# Patient Record
Sex: Female | Born: 1955 | Race: White | Hispanic: No | Marital: Married | Smoking: Never smoker
Health system: Southern US, Community
[De-identification: ages and names within clinical notes are randomized; demographics above are authoritative.]

## PROBLEM LIST (undated history)

## (undated) ENCOUNTER — Ambulatory Visit: Admission: EM | Payer: BC Managed Care – PPO | Source: Home / Self Care

## (undated) DIAGNOSIS — E785 Hyperlipidemia, unspecified: Secondary | ICD-10-CM

## (undated) DIAGNOSIS — I10 Essential (primary) hypertension: Secondary | ICD-10-CM

## (undated) DIAGNOSIS — M810 Age-related osteoporosis without current pathological fracture: Secondary | ICD-10-CM

## (undated) DIAGNOSIS — D219 Benign neoplasm of connective and other soft tissue, unspecified: Secondary | ICD-10-CM

## (undated) DIAGNOSIS — K5792 Diverticulitis of intestine, part unspecified, without perforation or abscess without bleeding: Secondary | ICD-10-CM

## (undated) HISTORY — PX: OTHER SURGICAL HISTORY: SHX169

## (undated) HISTORY — PX: ANKLE SURGERY: SHX546

---

## 2001-11-30 IMAGING — MG UNKNOWN MG STUDY
1 series · 4 of 4 positions shown · non-contrast
Comparison: none

REASON FOR EXAM: screening

[Series 9299: R CC · right · 4 of 4 slices shown]
[im 1/4]
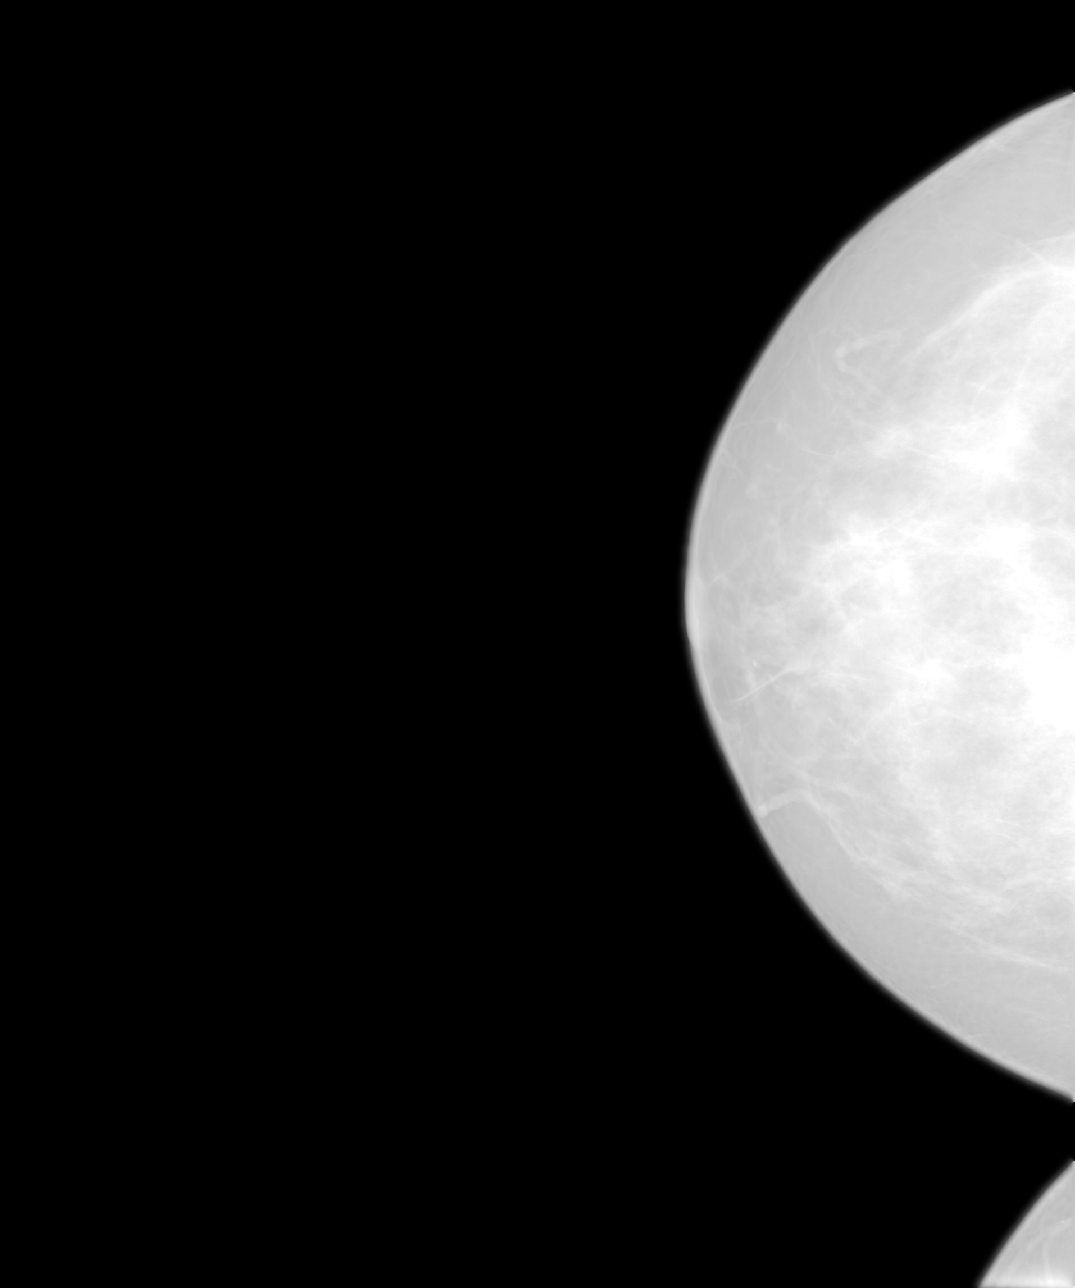
[im 2/4]
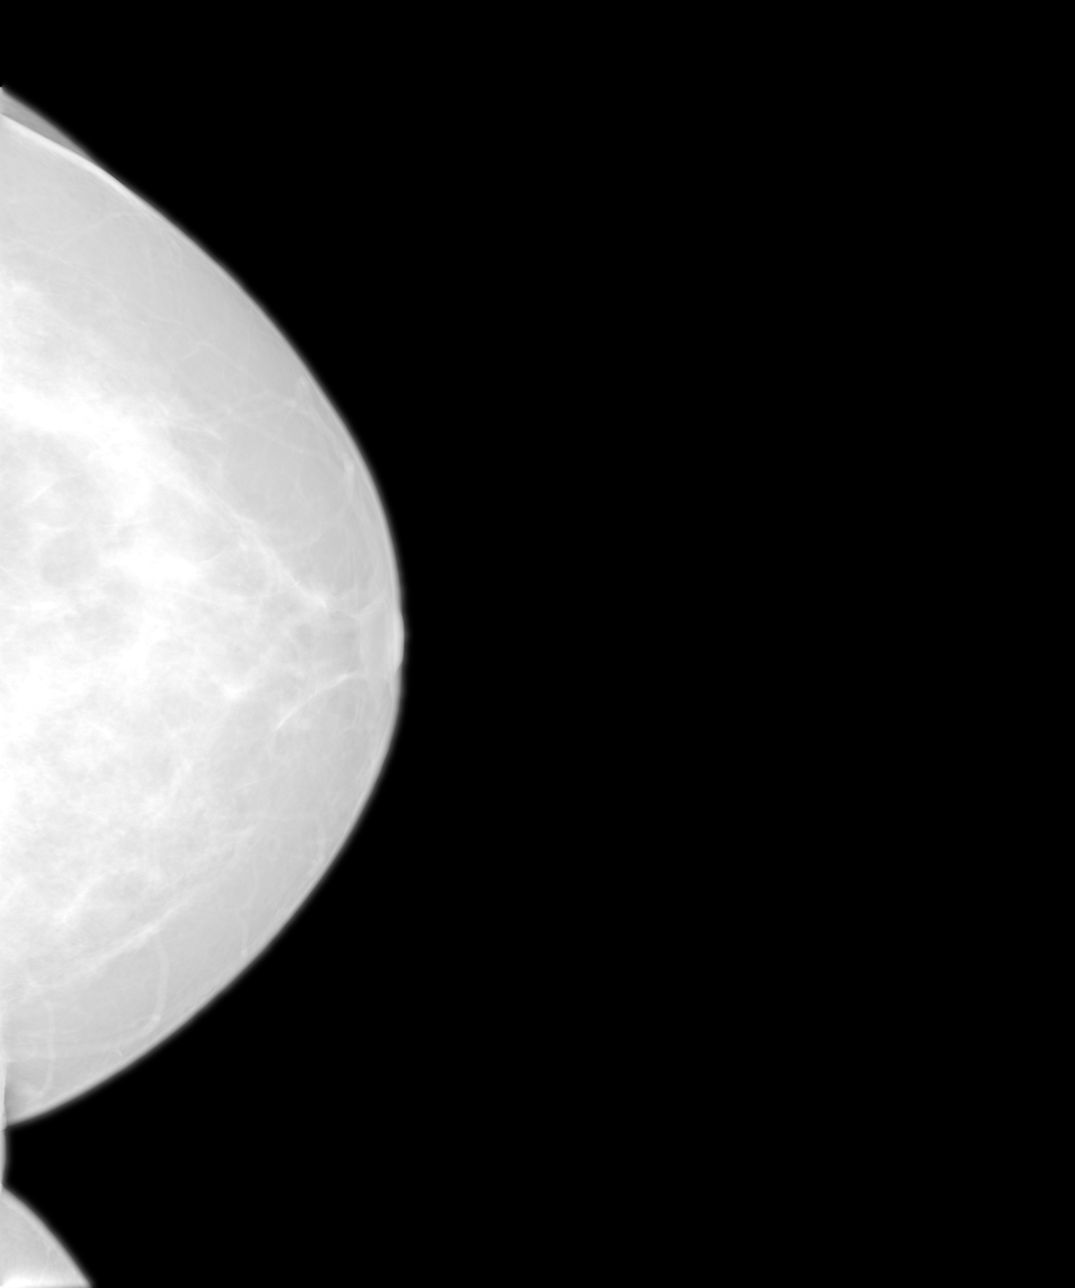
[im 3/4]
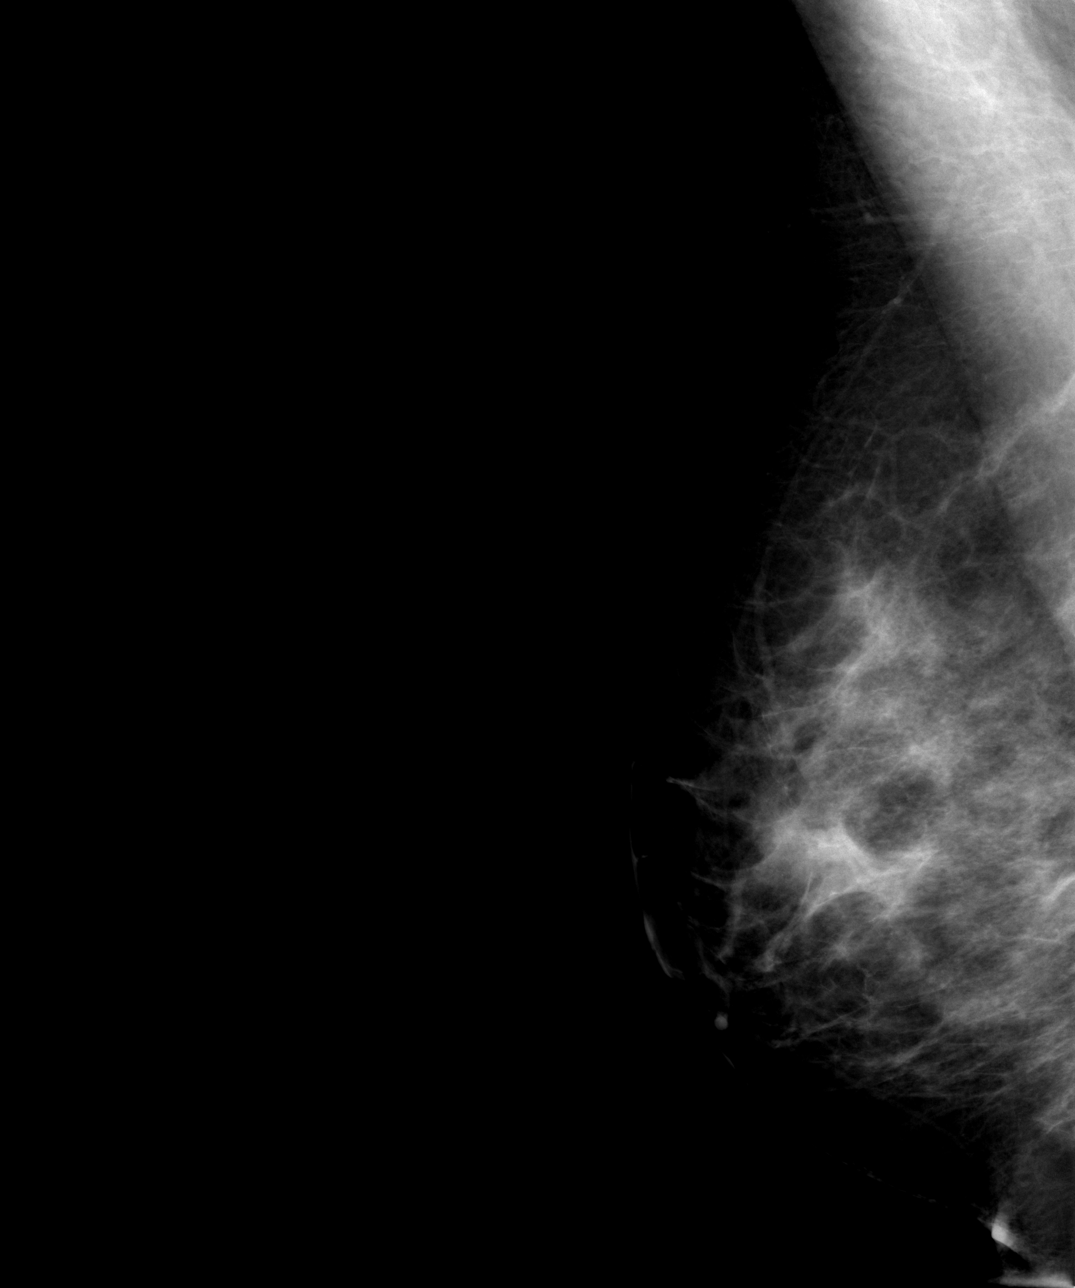
[im 4/4]
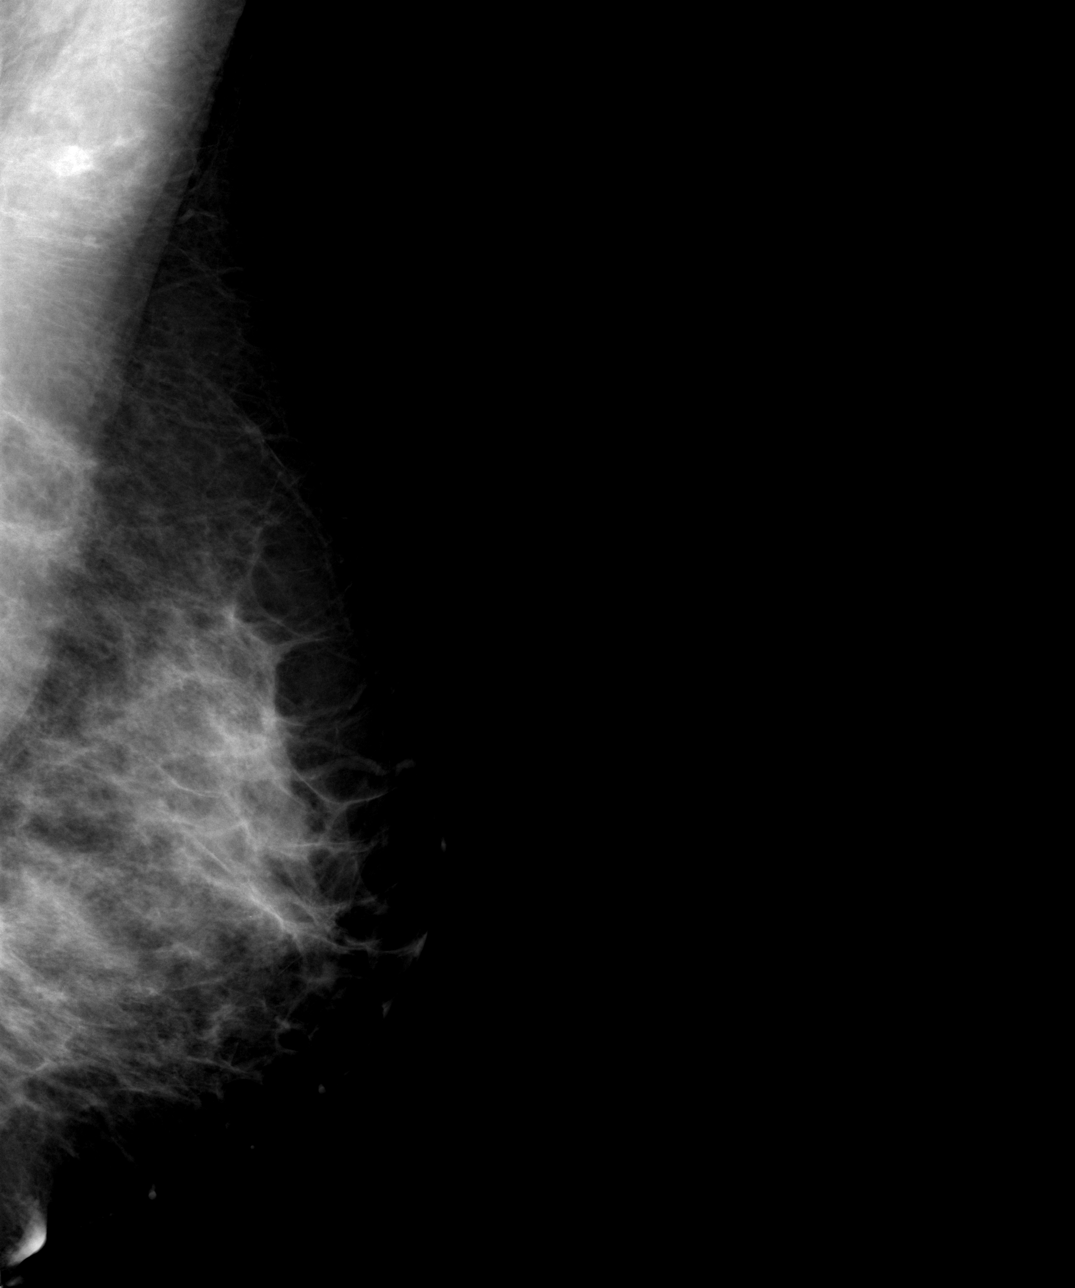

[4 of 4 positions shown; findings below may reference images not displayed]

Procedure: DIGITAL SCREENING MAMMOGRAM WITH CAD:
 Routine digital screening study is compared to the prior film screening
study dated [DATE] and a more recent study of [DATE]. The parenchymal
pattern appears to be stable with no evidence of developing density or
abnormal calcifications to suggest malignancy. Note is made on the current
digital images along the posterior margin of the RIGHT breast on the CC
there is some parenchymal density that is noted on the previous studies and
appears to be stable allowing for differences in technique and projection.
IMPRESSION: 1) Stable, benign appearing bilateral screening mammogram.

 BI-RADS: Category 2-Benign Finding
 This study was jointly interpreted by Dr. IZAMAR and Dr. IZAMAR
IZAMAR.
 A NEGATIVE MAMMOGRAM REPORT DOES NOT PRECLUDE BIOPSY OR OTHER EVALUATION OF
A CLINICALLY PALPABLE OR OTHERWISE SUSPICIOUS MASS OR LESION. BREAST CANCER
MAY NOT BE DETECTED BY MAMMOGRAPHY IN UP TO 10% OF CASES.

## 2003-09-09 ENCOUNTER — Other Ambulatory Visit: Payer: Self-pay

## 2003-12-31 ENCOUNTER — Ambulatory Visit: Payer: Self-pay | Admitting: Obstetrics and Gynecology

## 2005-02-19 ENCOUNTER — Ambulatory Visit: Payer: Self-pay | Admitting: Obstetrics and Gynecology

## 2006-11-11 ENCOUNTER — Ambulatory Visit: Payer: Self-pay | Admitting: Obstetrics and Gynecology

## 2006-11-15 ENCOUNTER — Ambulatory Visit: Payer: Self-pay | Admitting: Obstetrics and Gynecology

## 2006-11-15 IMAGING — US ULTRASOUND LEFT BREAST
1 series · 17 of 17 positions shown · non-contrast
Comparison: none

REASON FOR EXAM: US left breast dense parenchymal tissue
COMMENTS:

[Series 1: ultrasound left breast · 17 of 17 slices shown]
[im 1/17]
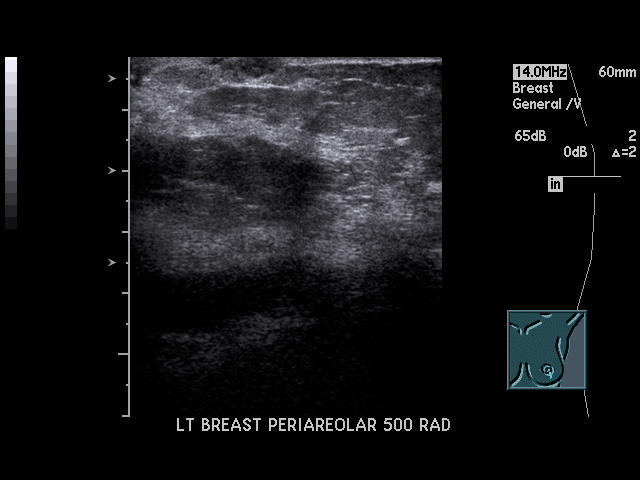
[im 2/17]
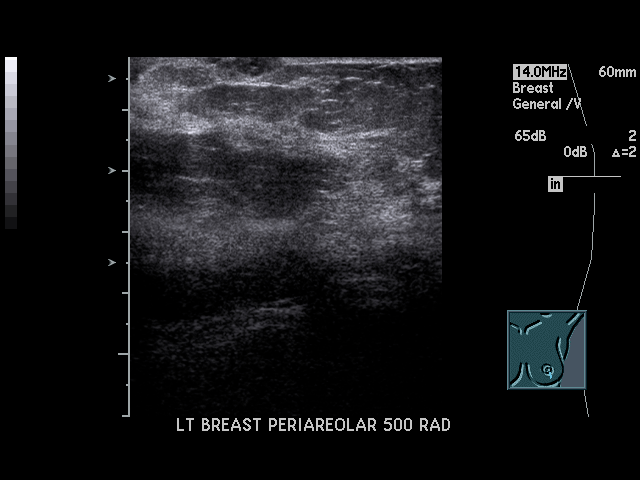
[im 3/17]
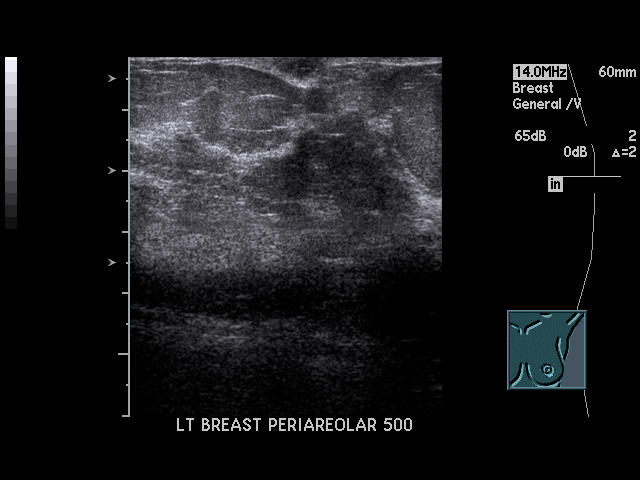
[im 4/17]
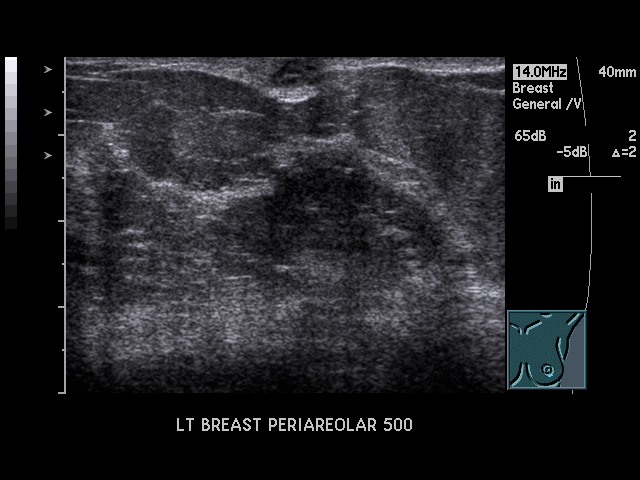
[im 5/17]
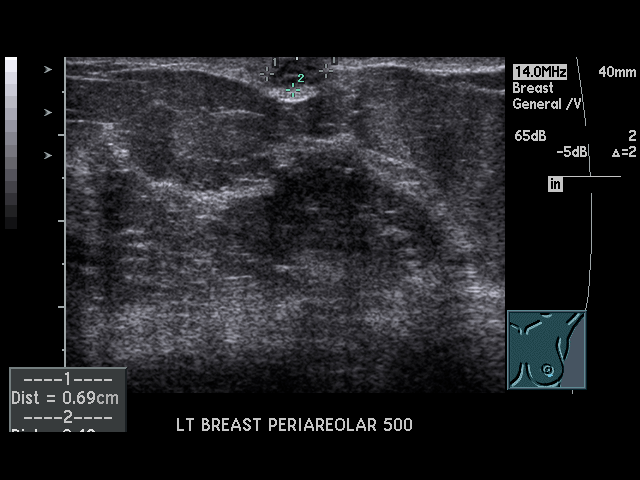
[im 6/17]
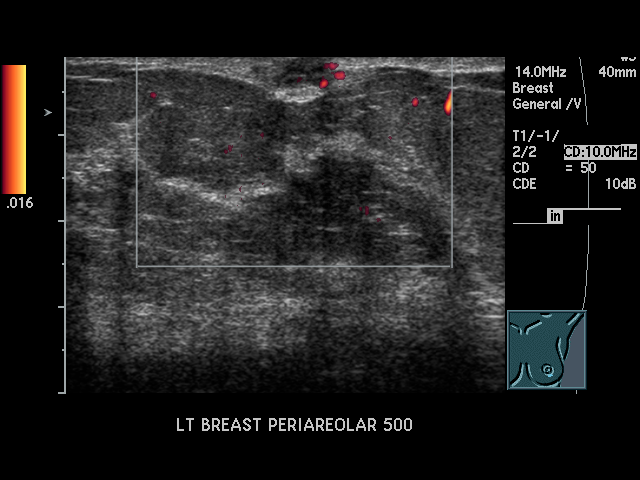
[im 7/17]
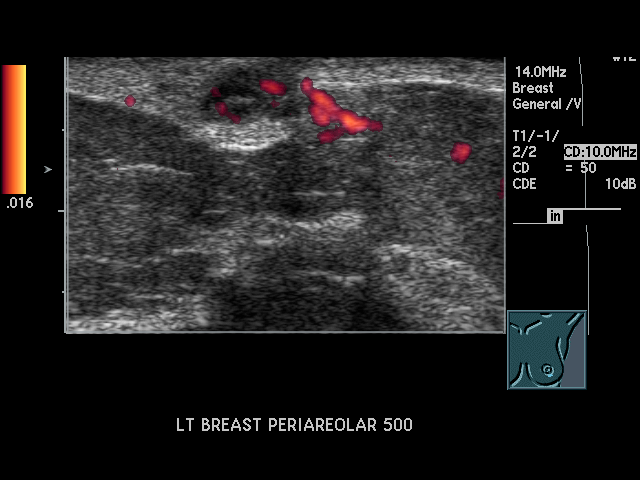
[im 8/17]
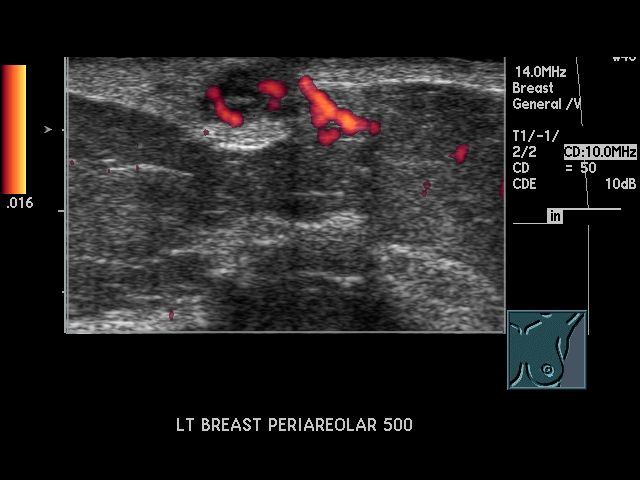
[im 9/17]
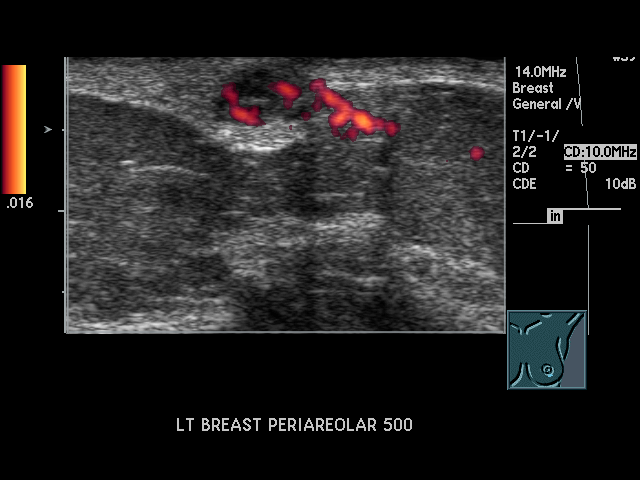
[im 10/17]
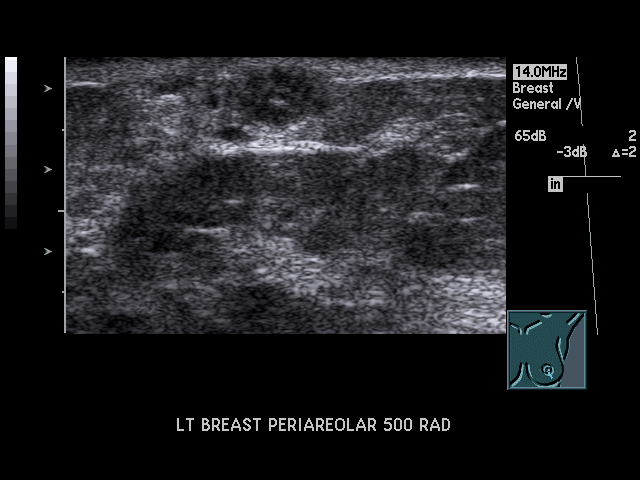
[im 11/17]
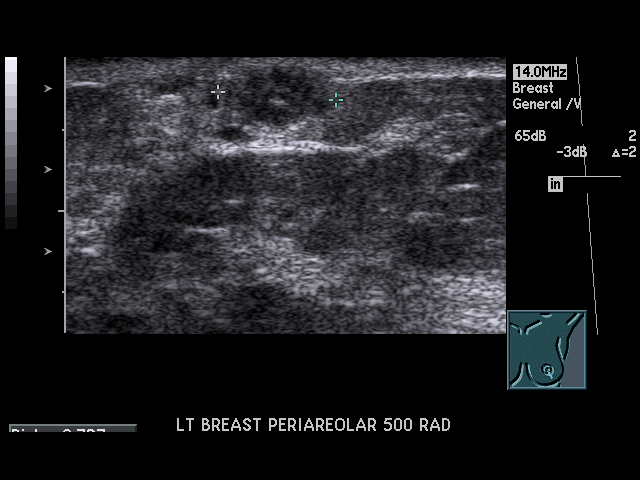
[im 12/17]
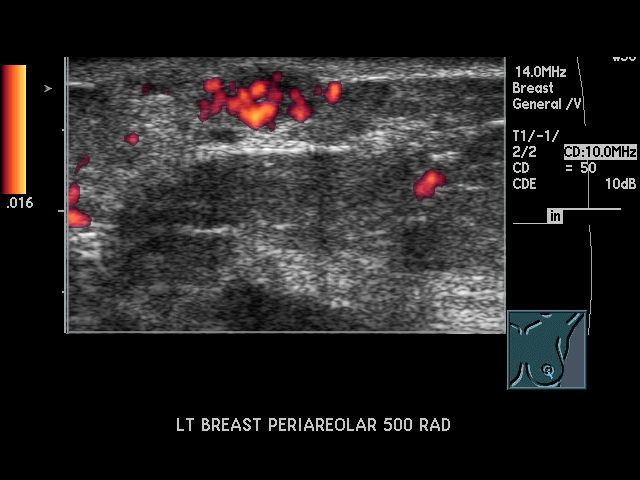
[im 13/17]
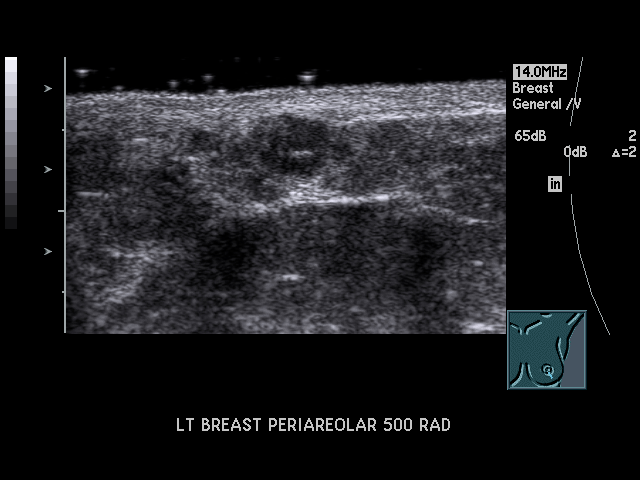
[im 14/17]
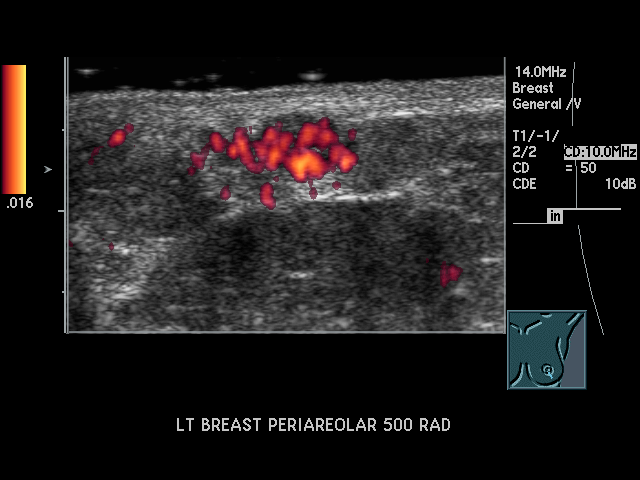
[im 15/17]
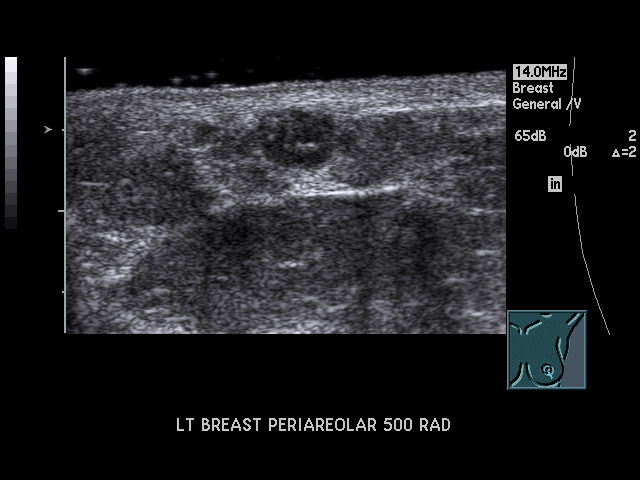
[im 16/17]
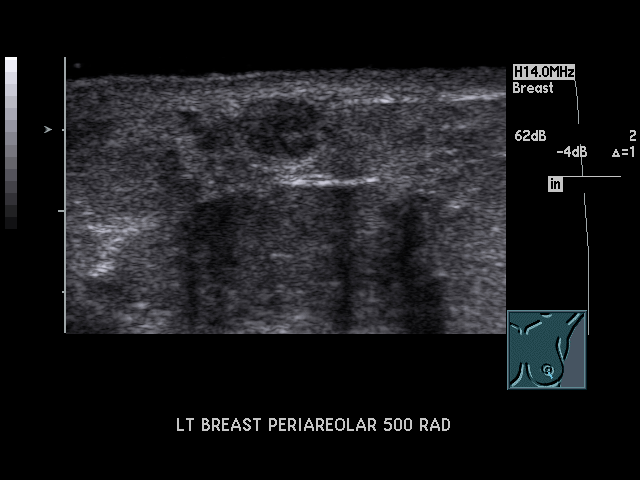
[im 17/17]
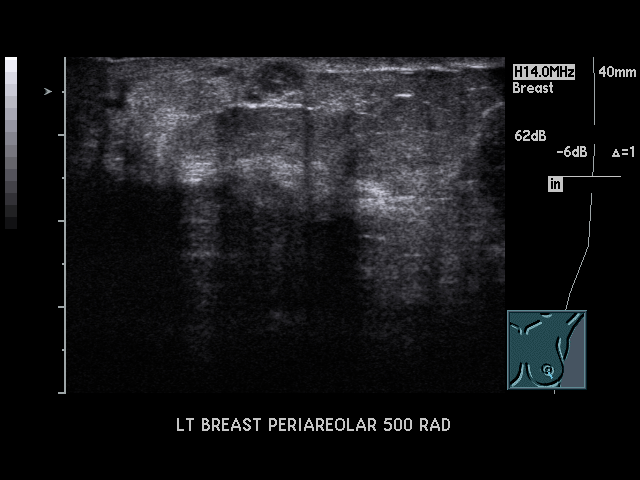

[17 of 17 positions shown; findings below may reference images not displayed]

PROCEDURE:     US  - US BREAST LEFT  - [DATE]  [DATE]

RESULT:     On the study [DATE], the patient was noted to have
a palpable nodule in the retroareolar region slightly laterally.  Ultrasound
today reveals an area of decreased echogenicity with some internal echoes
and some enhanced-through transmission.  This structure demonstrates
increased vascularity.  It measures 7 x 7 x 4 mm.
IMPRESSION: 1)There is a complex appearing vascular nodule in the retroareolar region at
approximately the 4 to 5 o'clock position which corresponds to the
mammographic finding.  Its appearance mammographically is indeterminate.
Surgical consultation is recommended.

BI-RADS: Category 4-Suspicious Abnormality, surgical consultation with an
eye toward close clinical follow-up and/or biopsy of a palpable nodule in
the LEFT breast in the periareolar region at the 4 to 5 o'clock position is
recommended.  It is felt that this is likely not a malignant lesion, but the
surgical consultation is recommended.

## 2007-02-24 ENCOUNTER — Ambulatory Visit: Payer: Self-pay | Admitting: Surgery

## 2007-02-24 IMAGING — US ULTRASOUND LEFT BREAST
1 series · 14 of 14 positions shown · non-contrast
Comparison: none

REASON FOR EXAM: 3 mo Left Breast Nodule 4/5 oclock
COMMENTS:

[Series 1: ultrasound left breast · 14 of 14 slices shown]
[im 1/14]
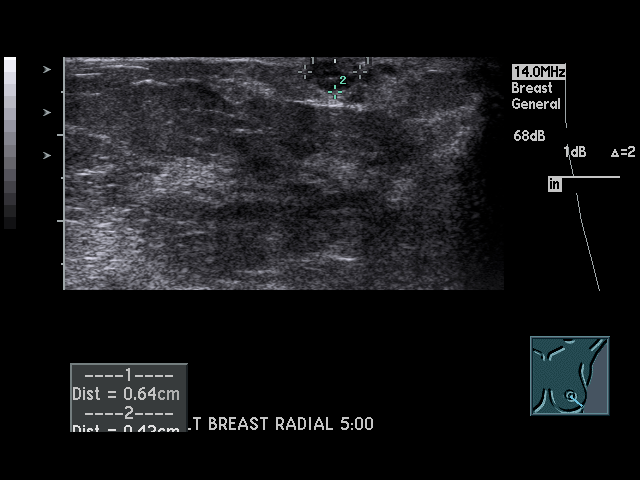
[im 2/14]
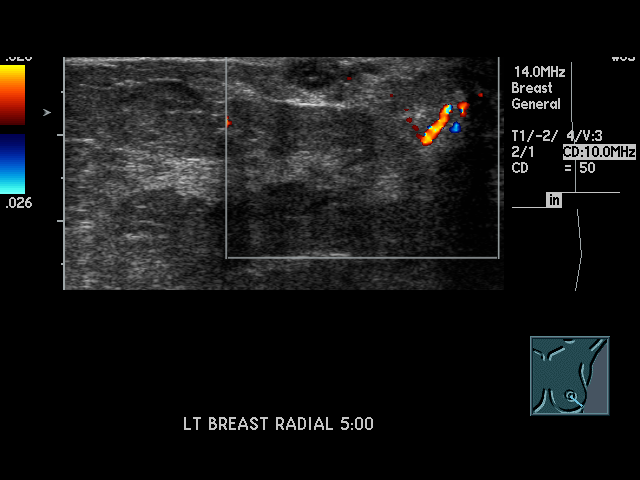
[im 3/14]
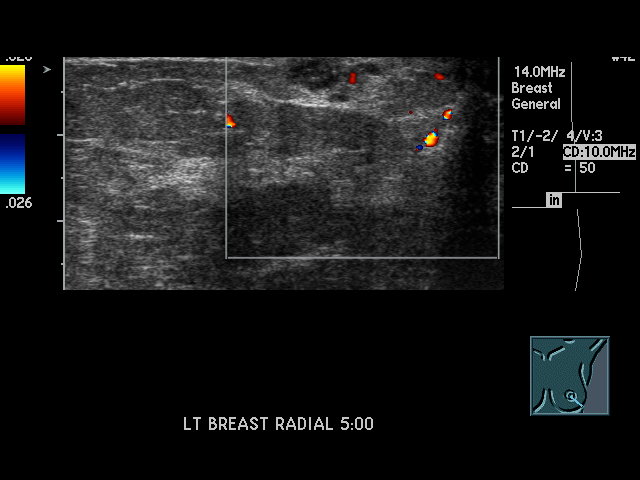
[im 4/14]
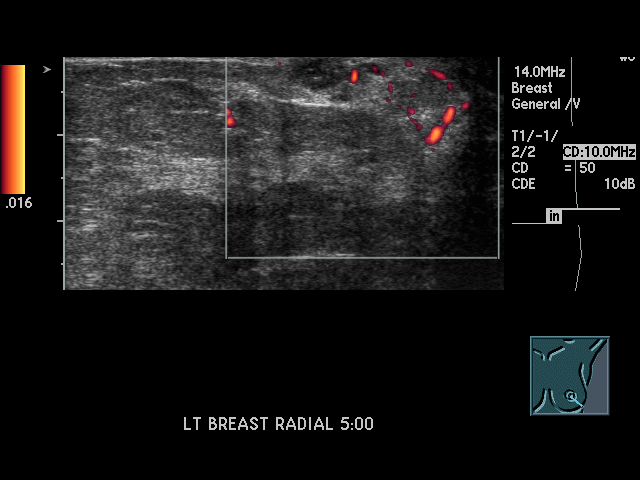
[im 5/14]
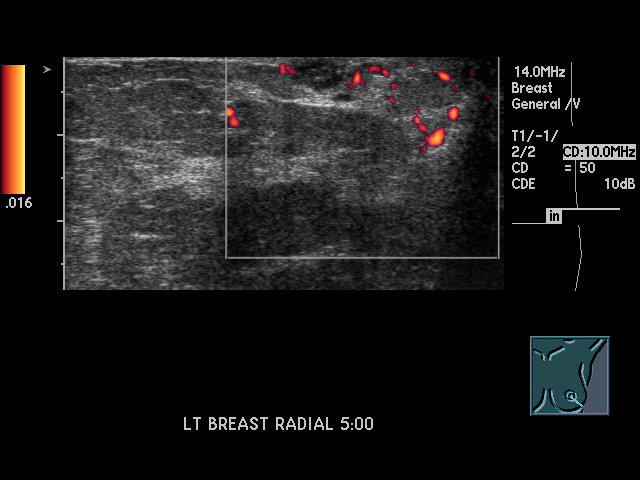
[im 6/14]
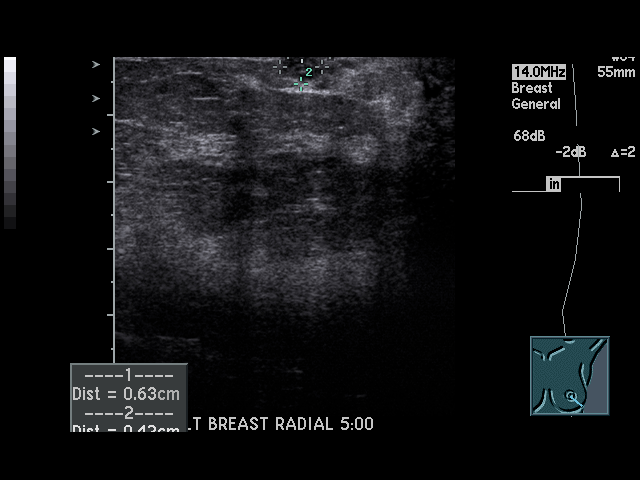
[im 7/14]
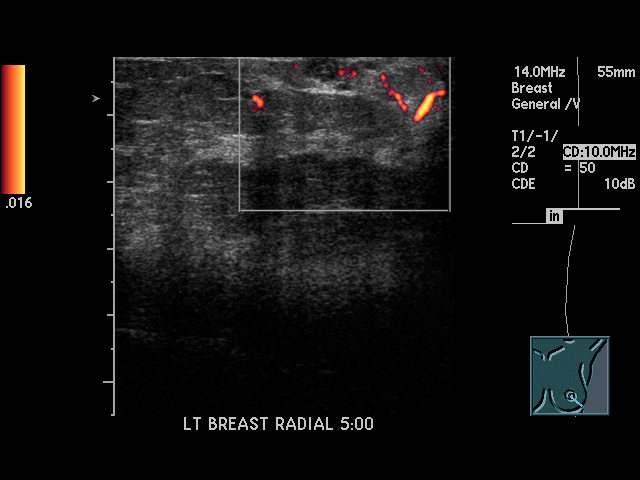
[im 8/14]
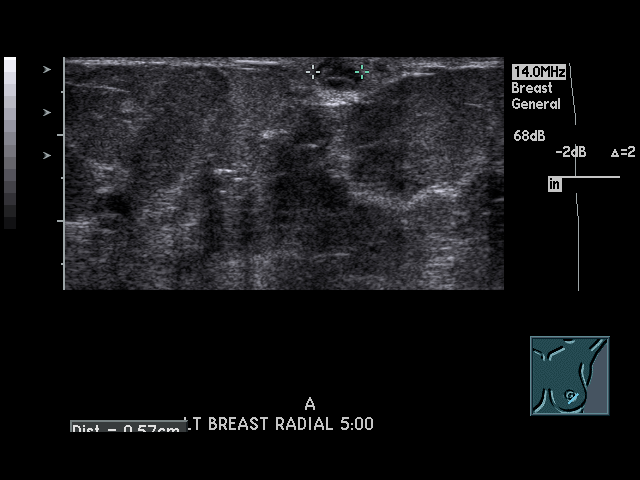
[im 9/14]
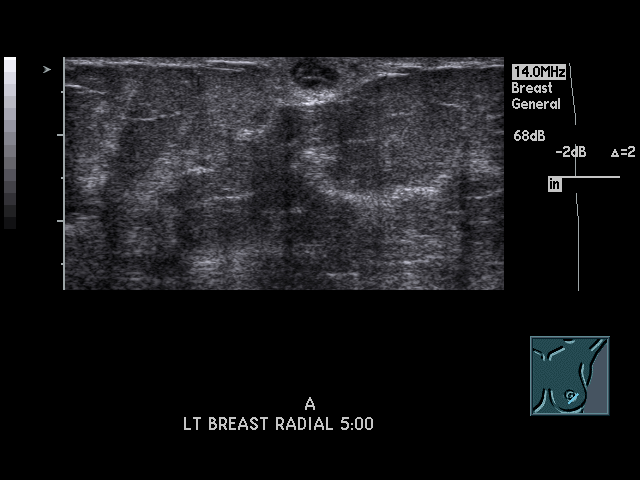
[im 10/14]
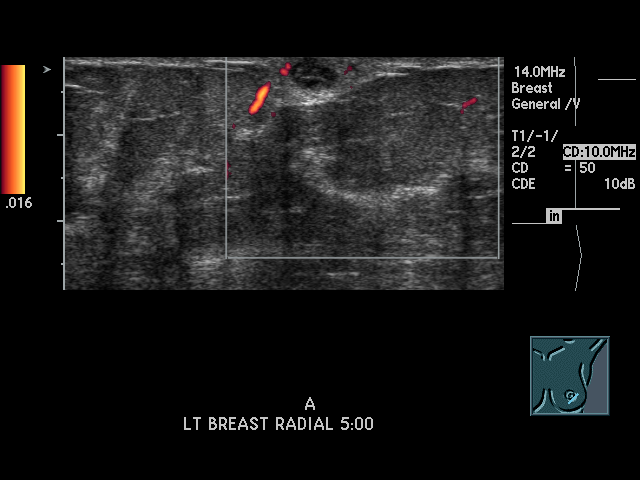
[im 11/14]
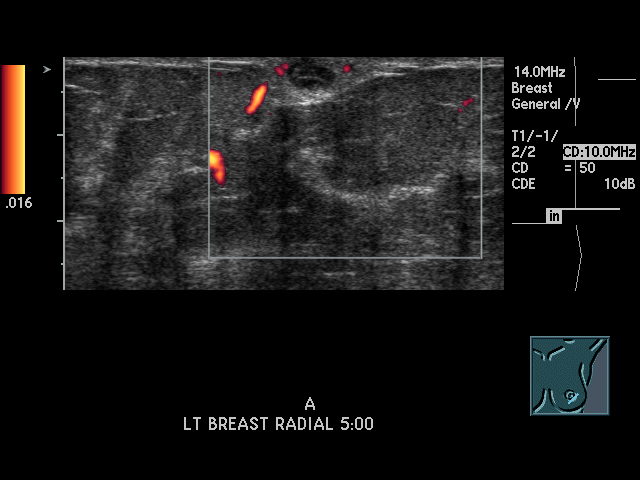
[im 12/14]
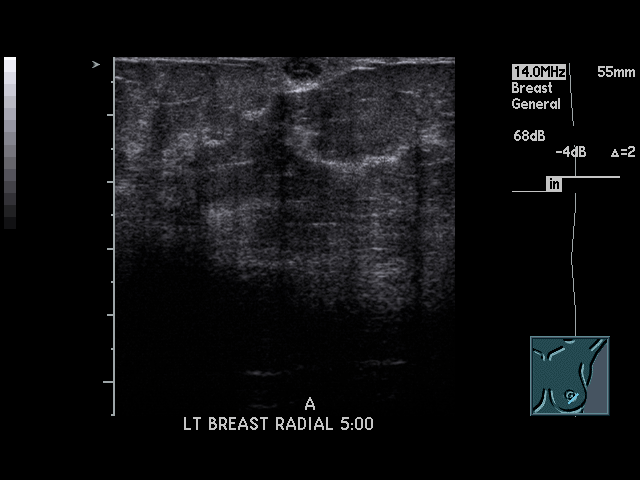
[im 13/14]
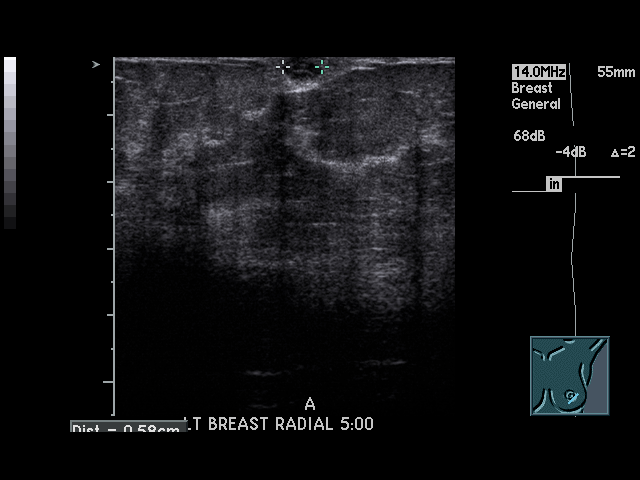
[im 14/14]
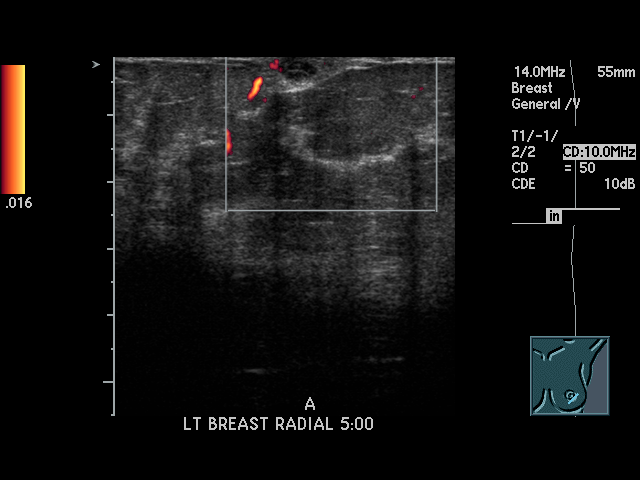

[14 of 14 positions shown; findings below may reference images not displayed]

PROCEDURE:     US  - US BREAST LEFT  - [DATE]  [DATE]

RESULT:       At approximately the 5 o'clock position on the LEFT, the
patient has been noted to have an approximately 7 x 7 x 4 mm diameter
hypoechoic nodule that was indeterminate. This area has been palpable in the
past.  Follow-up ultrasound today also reveals similar findings with a
hypoechoic nodule exhibiting some through transmission but also exhibiting
internal echoes.  Its measurement has not increased and may, in fact, have
decreased in size measuring 6 x 4 x 5 mm.
IMPRESSION: 1.     There is a persistent indeterminate nodule at approximately the 5
o'clock position of the LEFT breast.  This is likely a benign process but
this cannot be said with absolute certainty.  I feel that an additional
follow-up ultrasound in approximately 6-months would be a reasonable ongoing
follow up plan. A follow-up clinical exam midway through this period would
be useful as well.
2.     BI-RADS: Category 3- Probably Benign Finding (interval follow up).
Additional 6-month follow-up ultrasound and continued clinical follow up is
recommended.

A NEGATIVE MAMMOGRAM REPORT DOES NOT PRECLUDE BIOPSY OR OTHER EVALUATION OF
A CLINICALLY PALPABLE OR OTHERWISE SUPSICIOUS MASS OR LESION.  BREAST CANCER
MAY NOT BE DETECTED BY MAMMOGRAPHY IN UP TO 10% OF CASES.

## 2007-12-23 ENCOUNTER — Ambulatory Visit: Payer: Self-pay | Admitting: Obstetrics and Gynecology

## 2007-12-23 IMAGING — US ULTRASOUND LEFT BREAST
1 series · 6 of 6 positions shown · non-contrast
Comparison: none

REASON FOR EXAM: Lump at nipple left breast
COMMENTS:

[Series 1: ultrasound left breast · 6 of 6 slices shown]
[im 1/6]
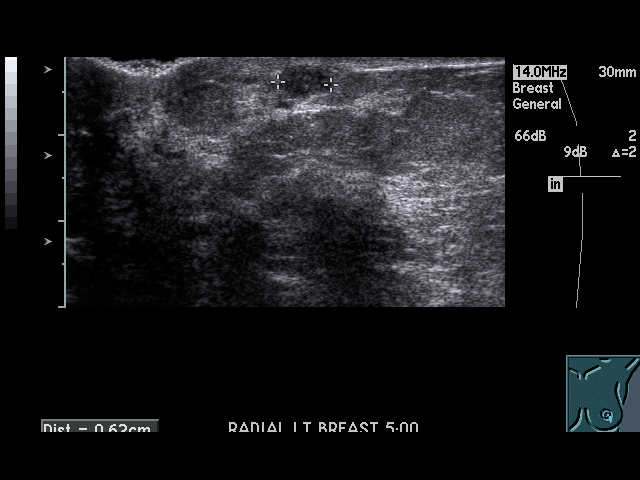
[im 2/6]
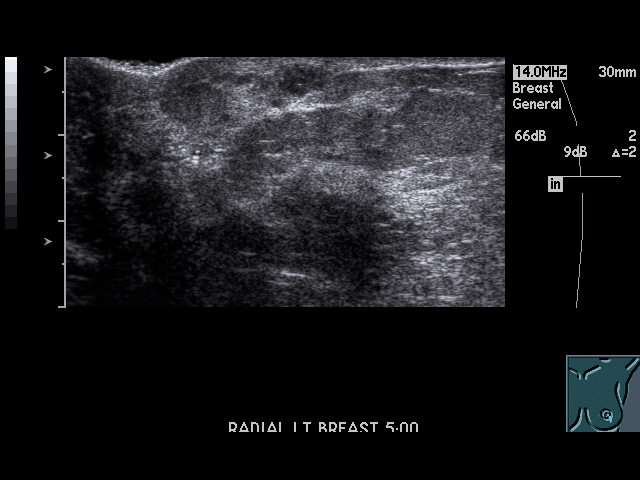
[im 3/6]
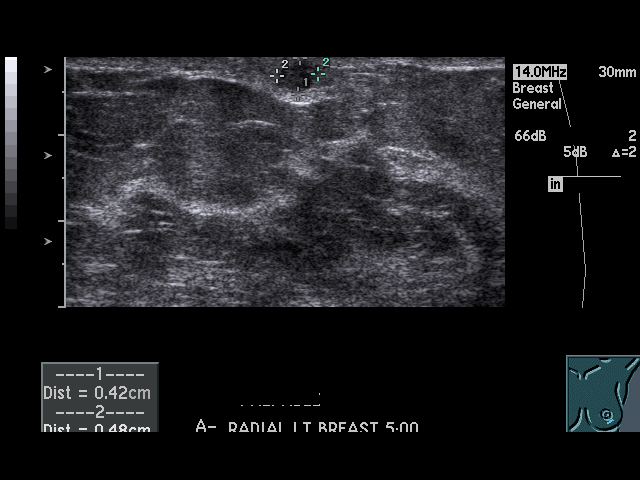
[im 4/6]
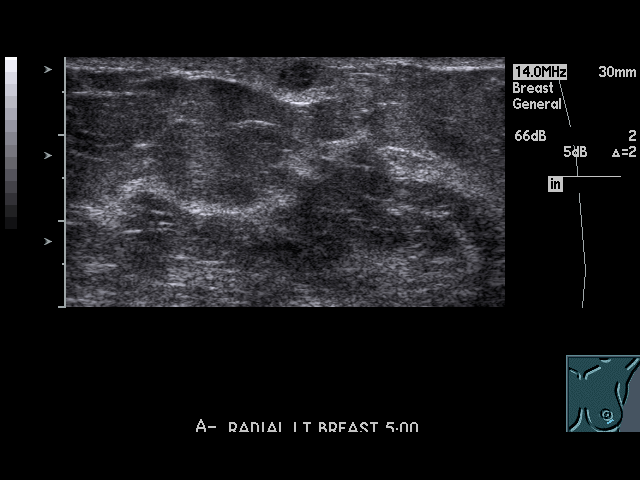
[im 5/6]
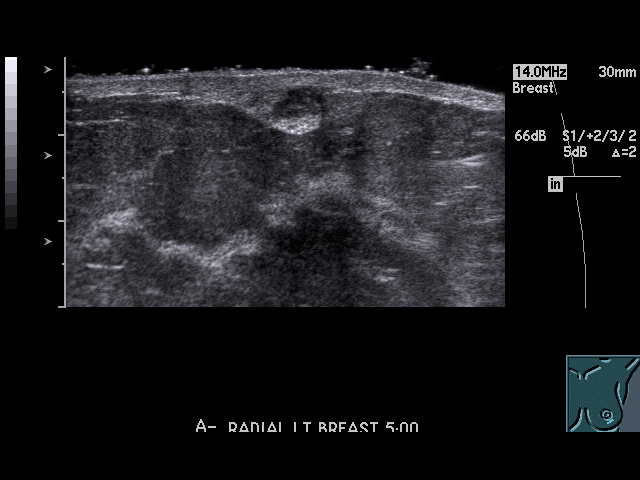
[im 6/6]
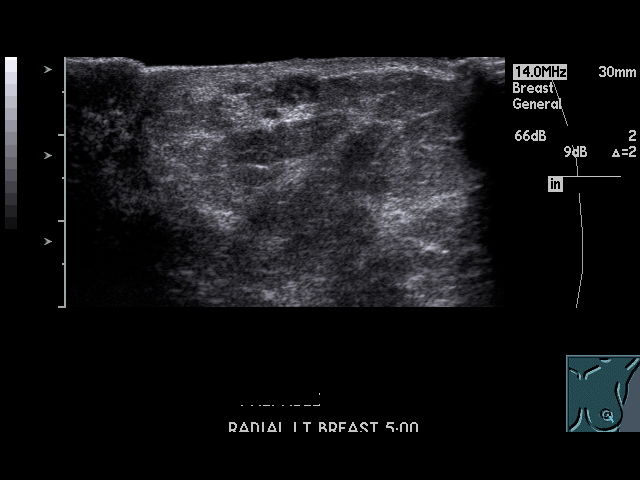

[6 of 6 positions shown; findings below may reference images not displayed]

PROCEDURE:     US  - US BREAST LEFT  - [DATE] [DATE]

RESULT:     The patient has a palpable area of nodularity in the
retroareolar region on the LEFT.

Ultrasound today reveals a hypoechoic, non-shadowing structure which in fact
demonstrates a small amount of enhanced through transmission. This measures
approximately 5.0 x 4.0 x 6.0 mm. No discrete, simple appearing cyst is
seen.
IMPRESSION: There is a nodule measuring 5.0 x 4.0 x 6.0 mm in the
retroareolar region of the LEFT breast, corresponding to the palpable
finding. It may be solid or be cystic with debris. Please see the dictation
of the mammogram of this same day for final recommendations and BI-RADS
classification.

## 2009-01-03 ENCOUNTER — Ambulatory Visit: Payer: Self-pay | Admitting: Obstetrics and Gynecology

## 2009-05-13 ENCOUNTER — Inpatient Hospital Stay: Payer: Self-pay | Admitting: Orthopedic Surgery

## 2009-05-13 IMAGING — CR DG ANKLE COMPLETE 3+V*L*
1 series · 5 of 5 positions shown · non-contrast
Comparison: none

REASON FOR EXAM: fell apparent fx/disloc
COMMENTS:

[Series 1: view not recorded · 0.17mm/px · 5 of 5 slices shown]
[im 1/5]
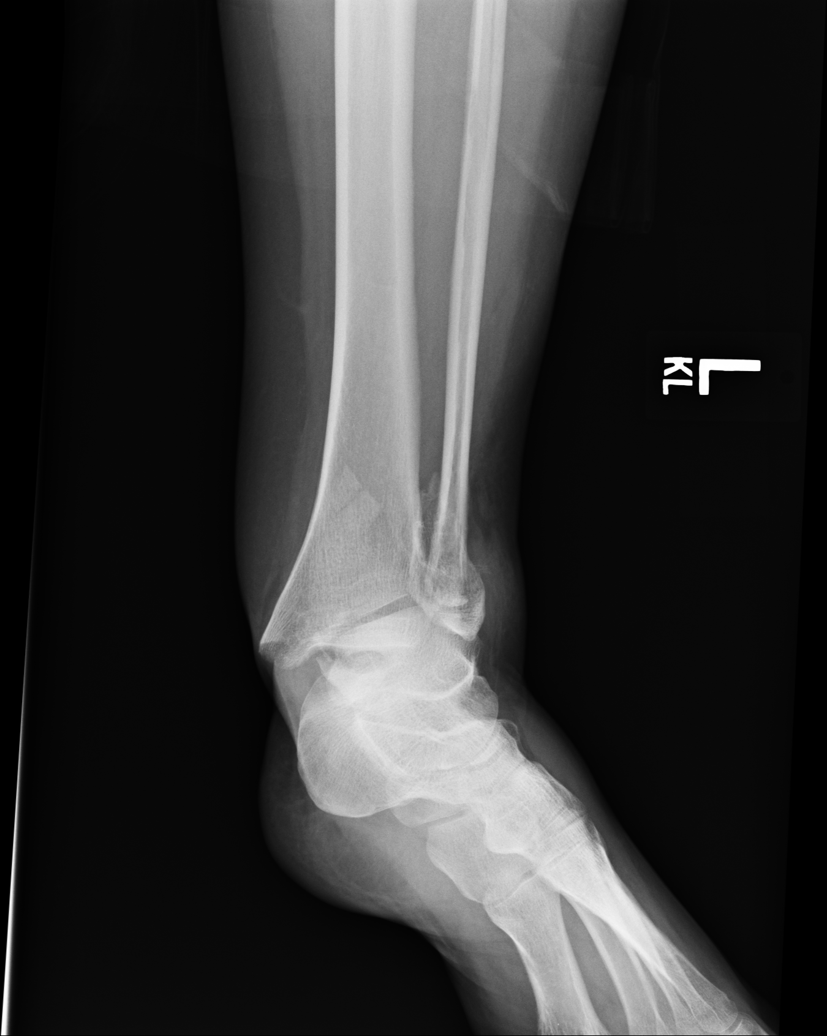
[im 2/5]
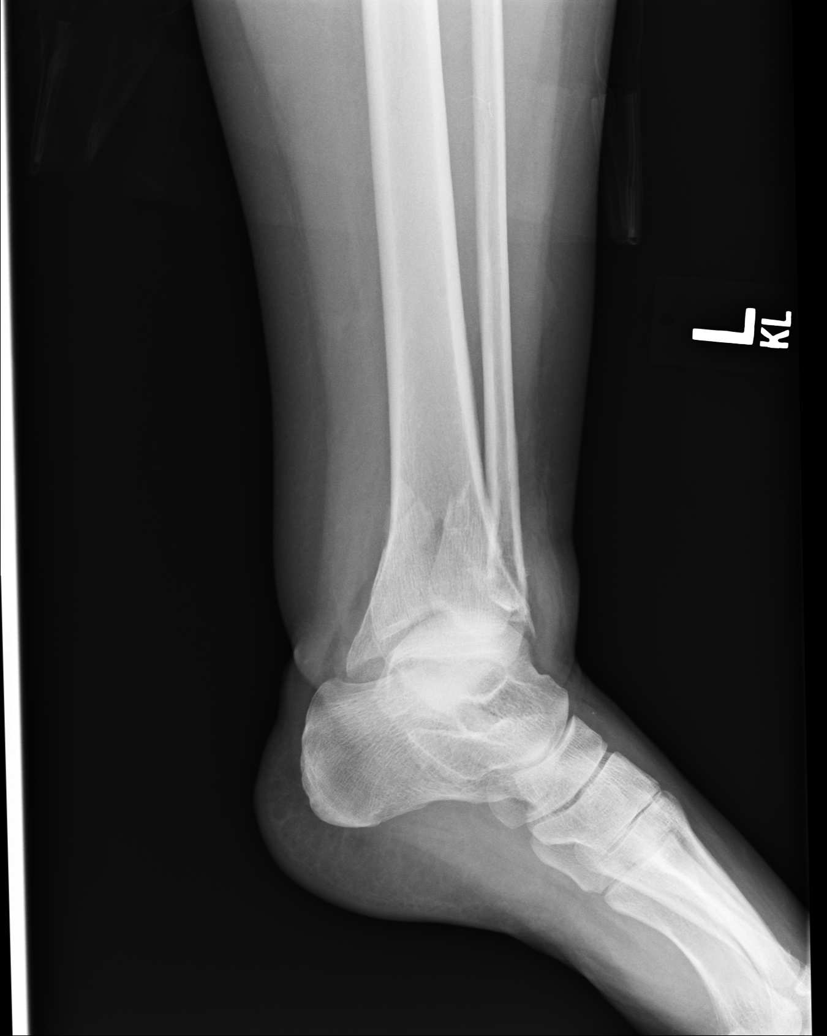
[im 3/5]
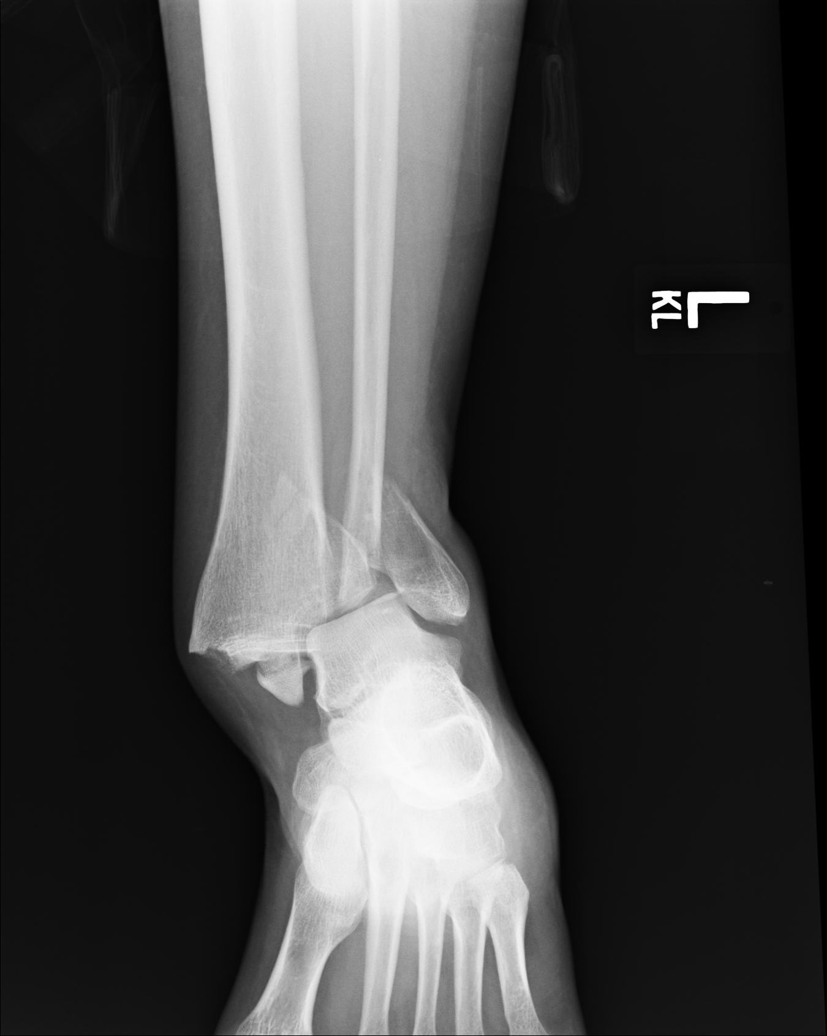
[im 4/5]
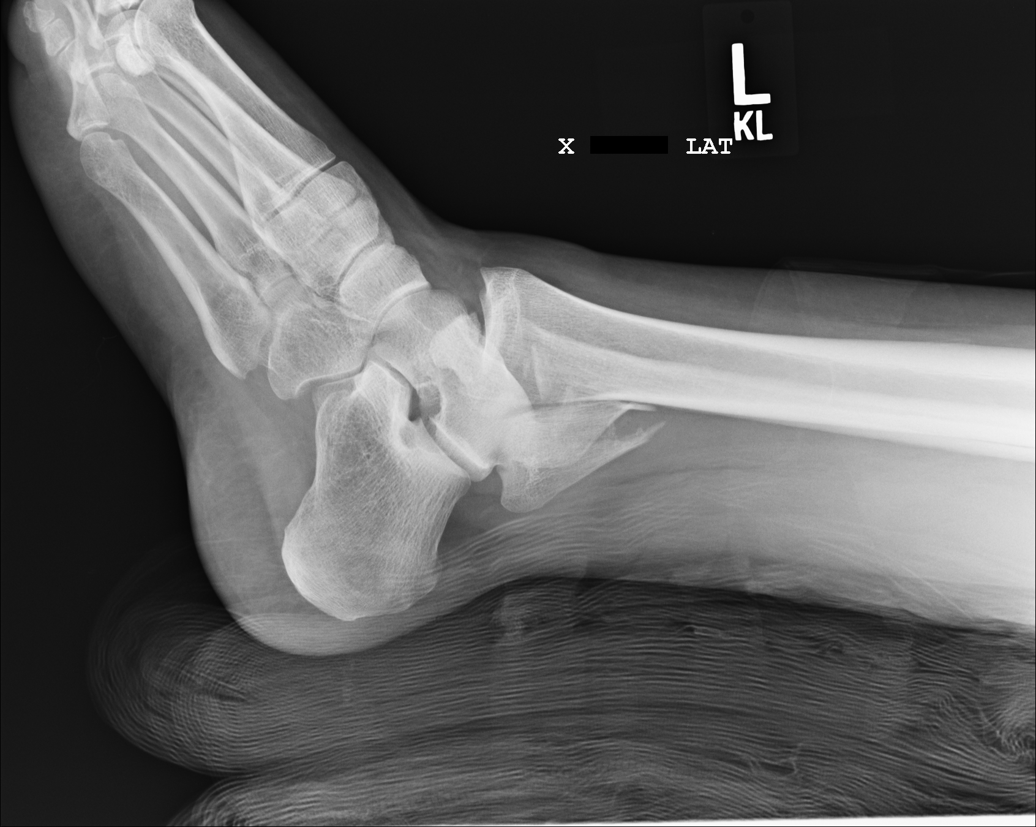
[im 5/5]
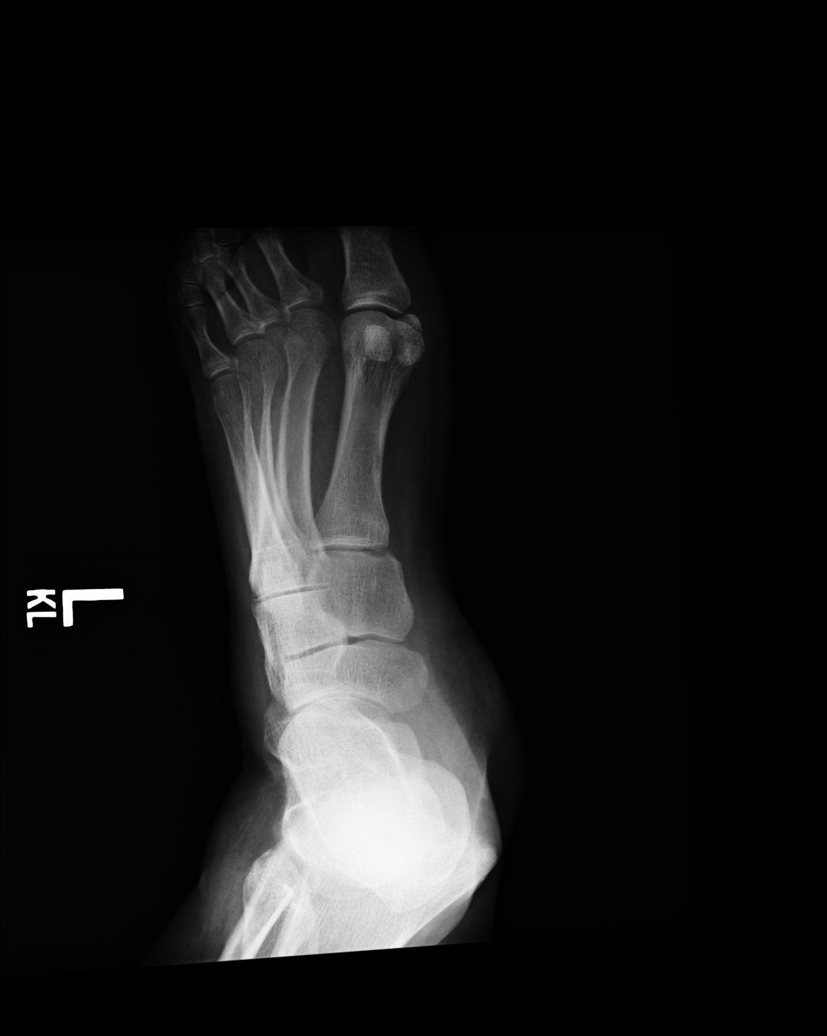

[5 of 5 positions shown; findings below may reference images not displayed]

PROCEDURE:     DXR - DXR ANKLE LEFT COMPLETE  - [DATE]  [DATE]

RESULT:     There is a trimalleolar fracture dislocation at the left ankle.
The major tibial fracture component is subluxed medially with respect to the
talus. There is observed an angulated fracture of the distal fibula.
Fractures of the posterior tibia and of the medial malleolus are observed.
IMPRESSION: 1.     Trimalleolar fracture-dislocation of the left ankle with tibiotalar
subluxation noted as mentioned above.

## 2009-05-13 IMAGING — CR DG ANKLE COMPLETE 3+V*L*
1 series · 4 of 4 positions shown · non-contrast
Comparison: none

REASON FOR EXAM: evaluate hardware post-op
COMMENTS:   Bedside (portable):Y

[Series 1: view not recorded · 0.17mm/px · 4 of 4 slices shown]
[im 1/4]
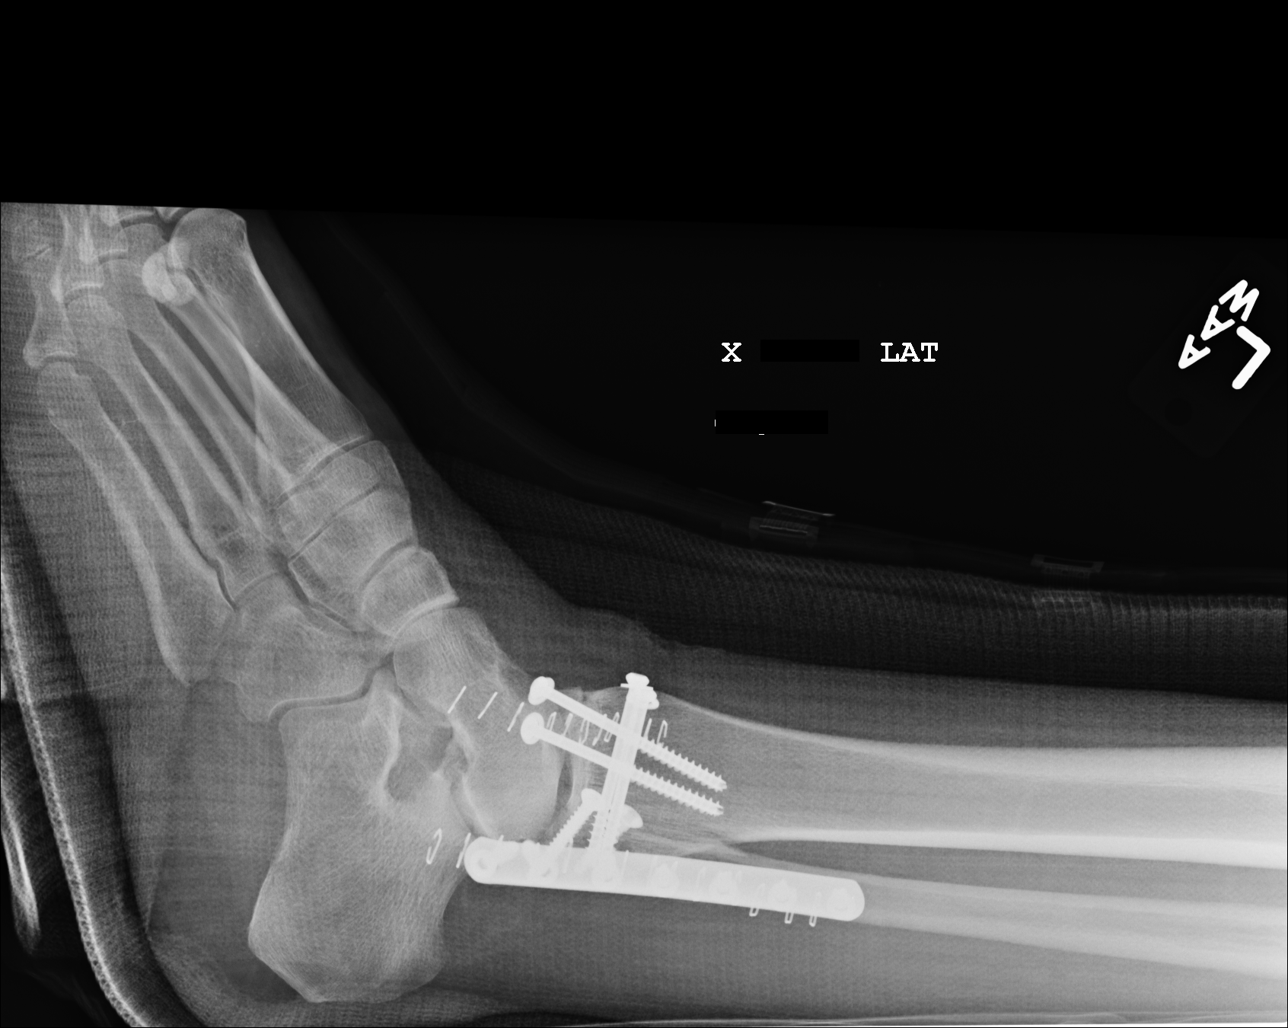
[im 2/4]
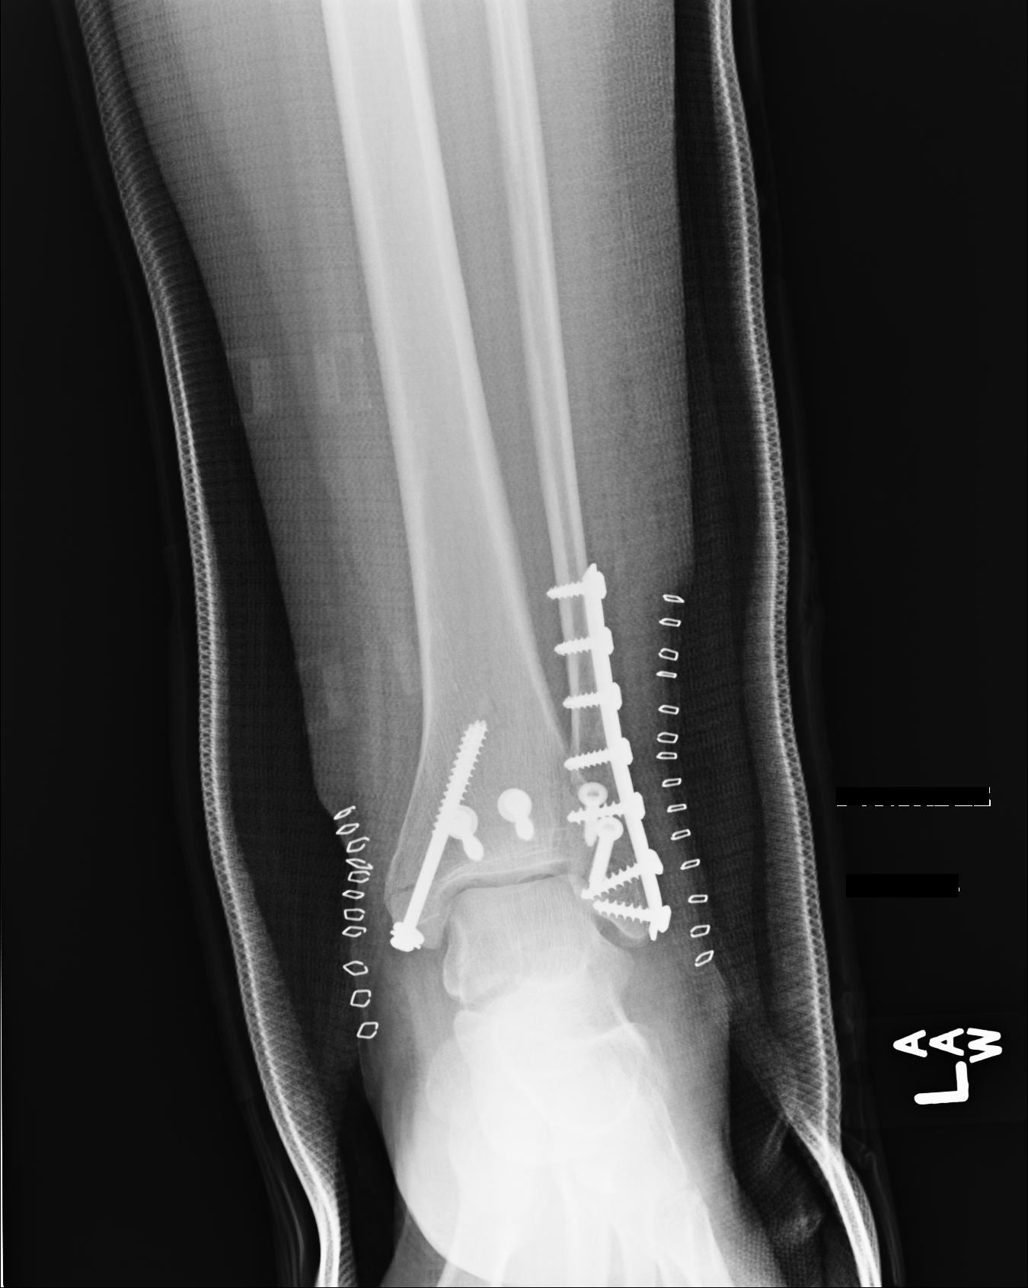
[im 3/4]
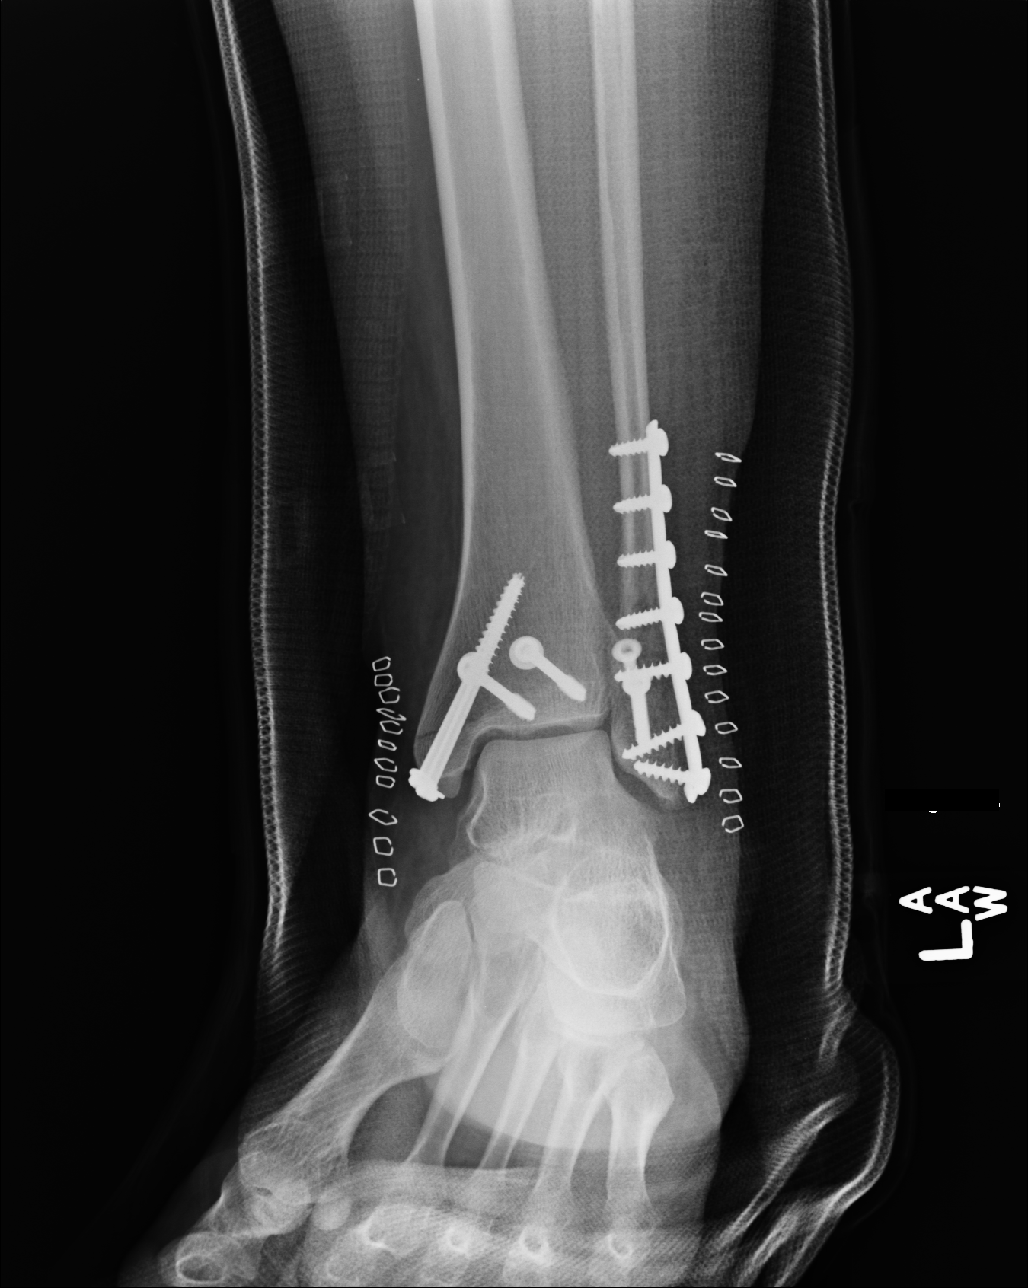
[im 4/4]
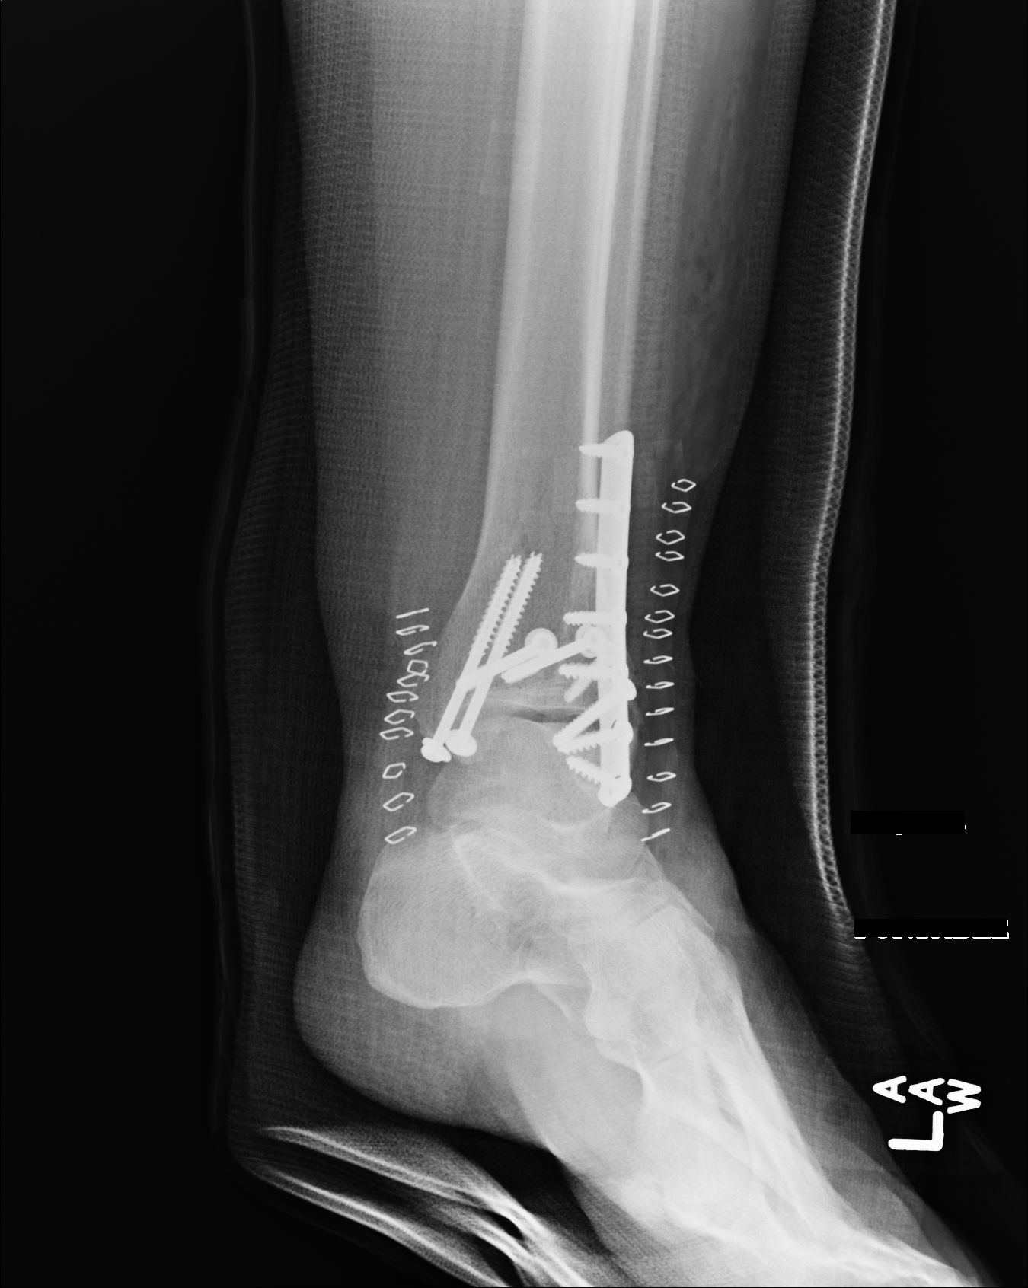

[4 of 4 positions shown; findings below may reference images not displayed]

PROCEDURE:     DXR - DXR ANKLE LEFT COMPLETE  - [DATE] [DATE]

RESULT:     The previously present trimalleolar fracture/dislocation has
been reduced. The medial malleolar and posterior malleolar fracture
components are now stabilized by metallic screws. The fibular fracture is
now stabilized and near anatomic position by a metallic side-plate with
multiple side-plate screws and two additional screws directed in the
posterior/anterior plane. The hardware is intact. The ankle mortise appears
symmetrical. A cast is now present.
IMPRESSION: Trimalleolar fracture/dislocation at the left ankle has been
reduced and internally stabilized as noted above.

## 2009-05-13 IMAGING — CR DG ANKLE COMPLETE 3+V*L*
1 series · 3 of 3 positions shown · non-contrast
Comparison: none

REASON FOR EXAM: post reduction.  dr only want mortise , ap and laterial
only. pain,
COMMENTS:   Bedside (portable):Y

[Series 1: view not recorded · 0.17mm/px · 3 of 3 slices shown]
[im 1/3]
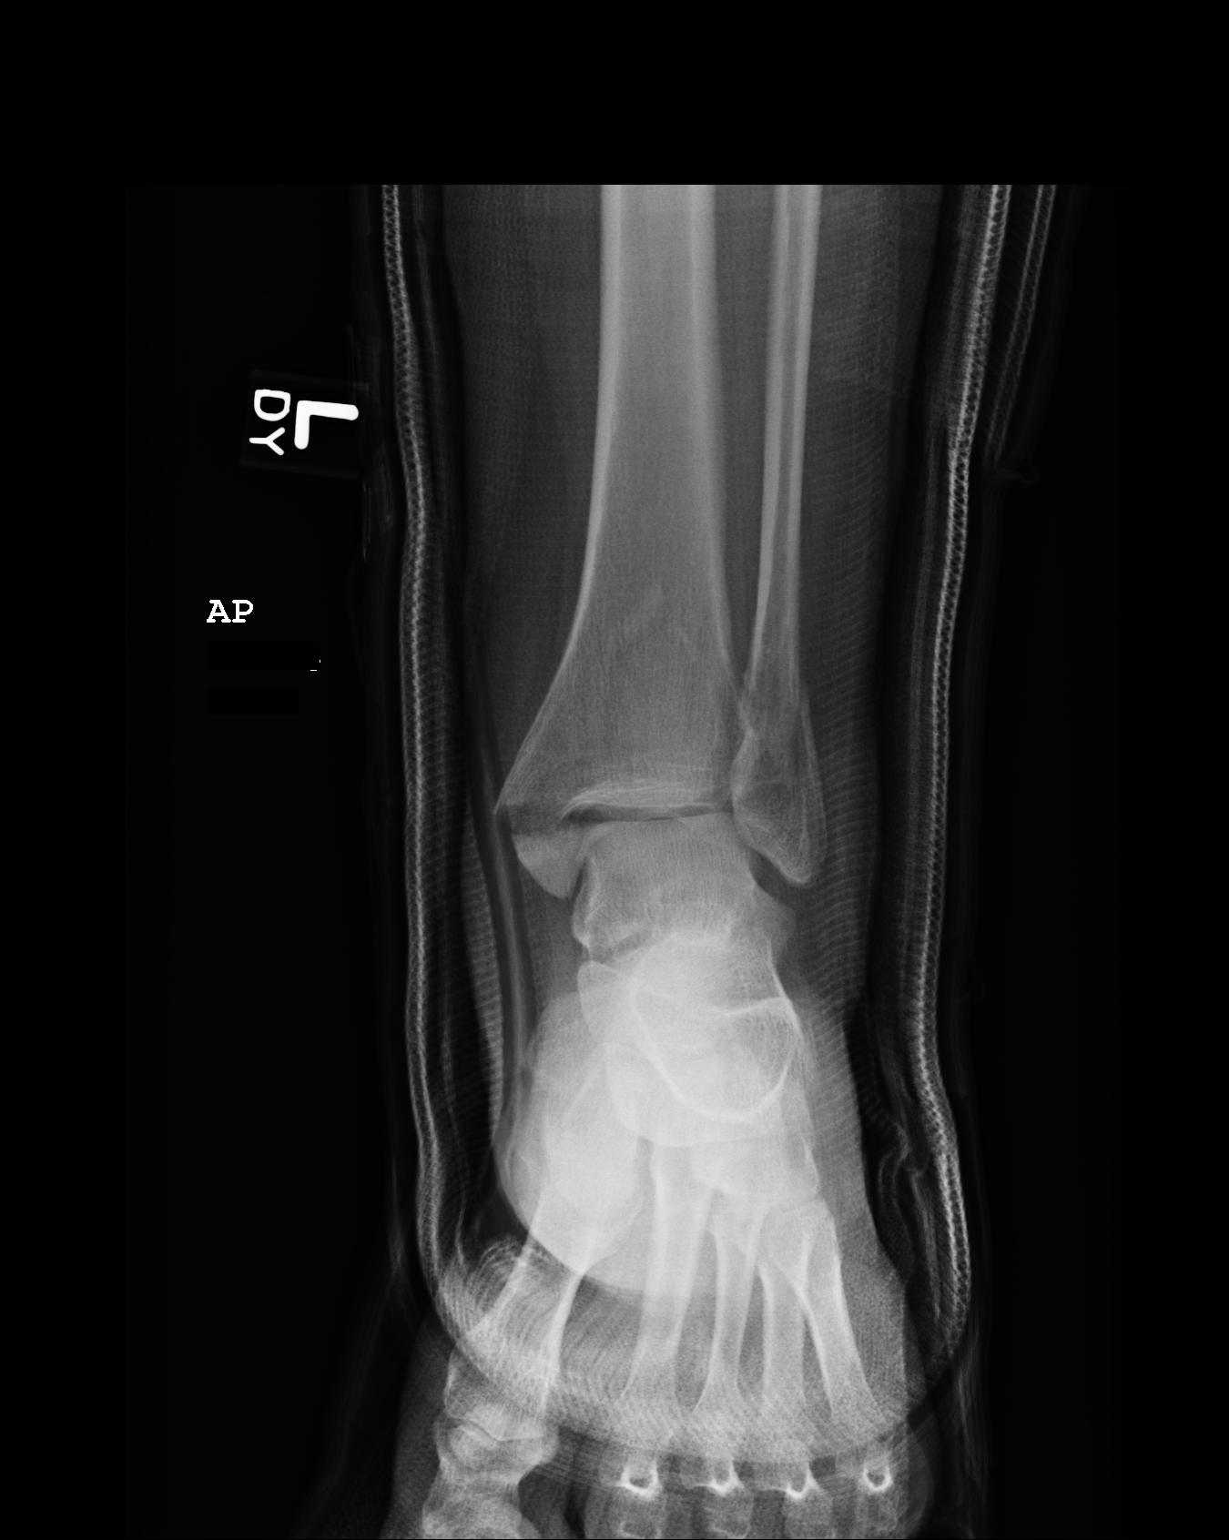
[im 2/3]
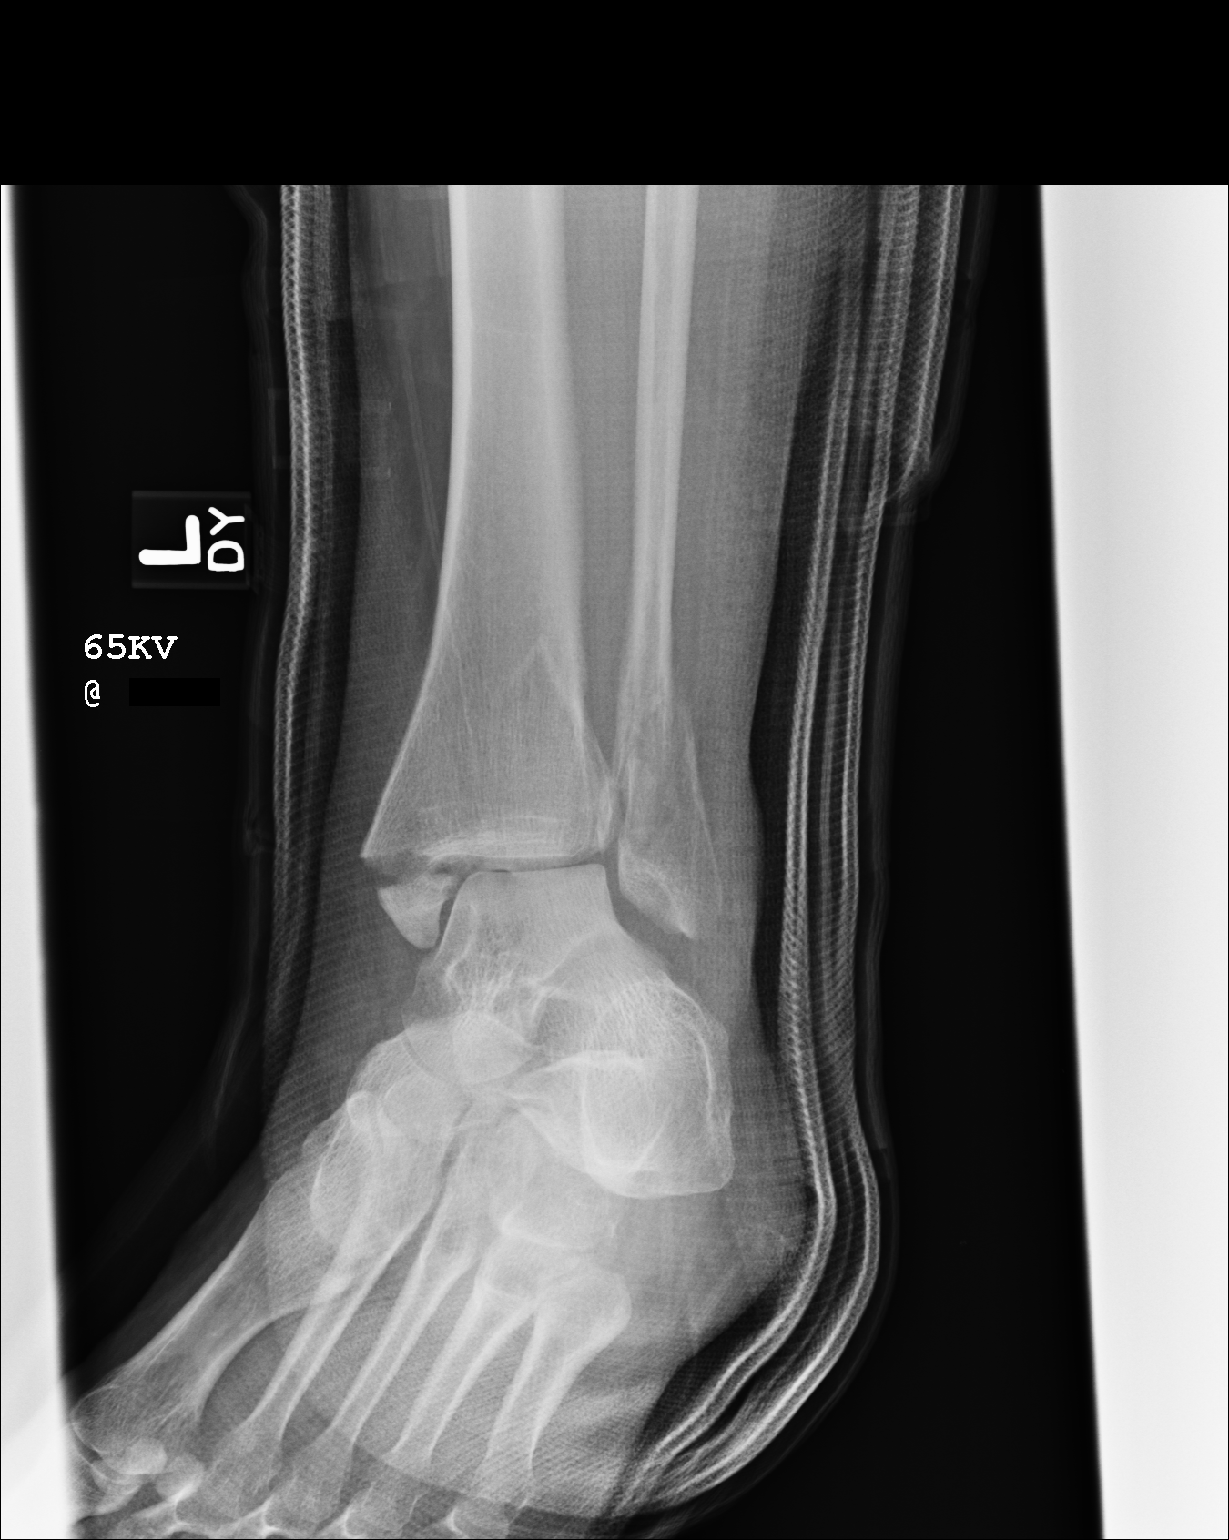
[im 3/3]
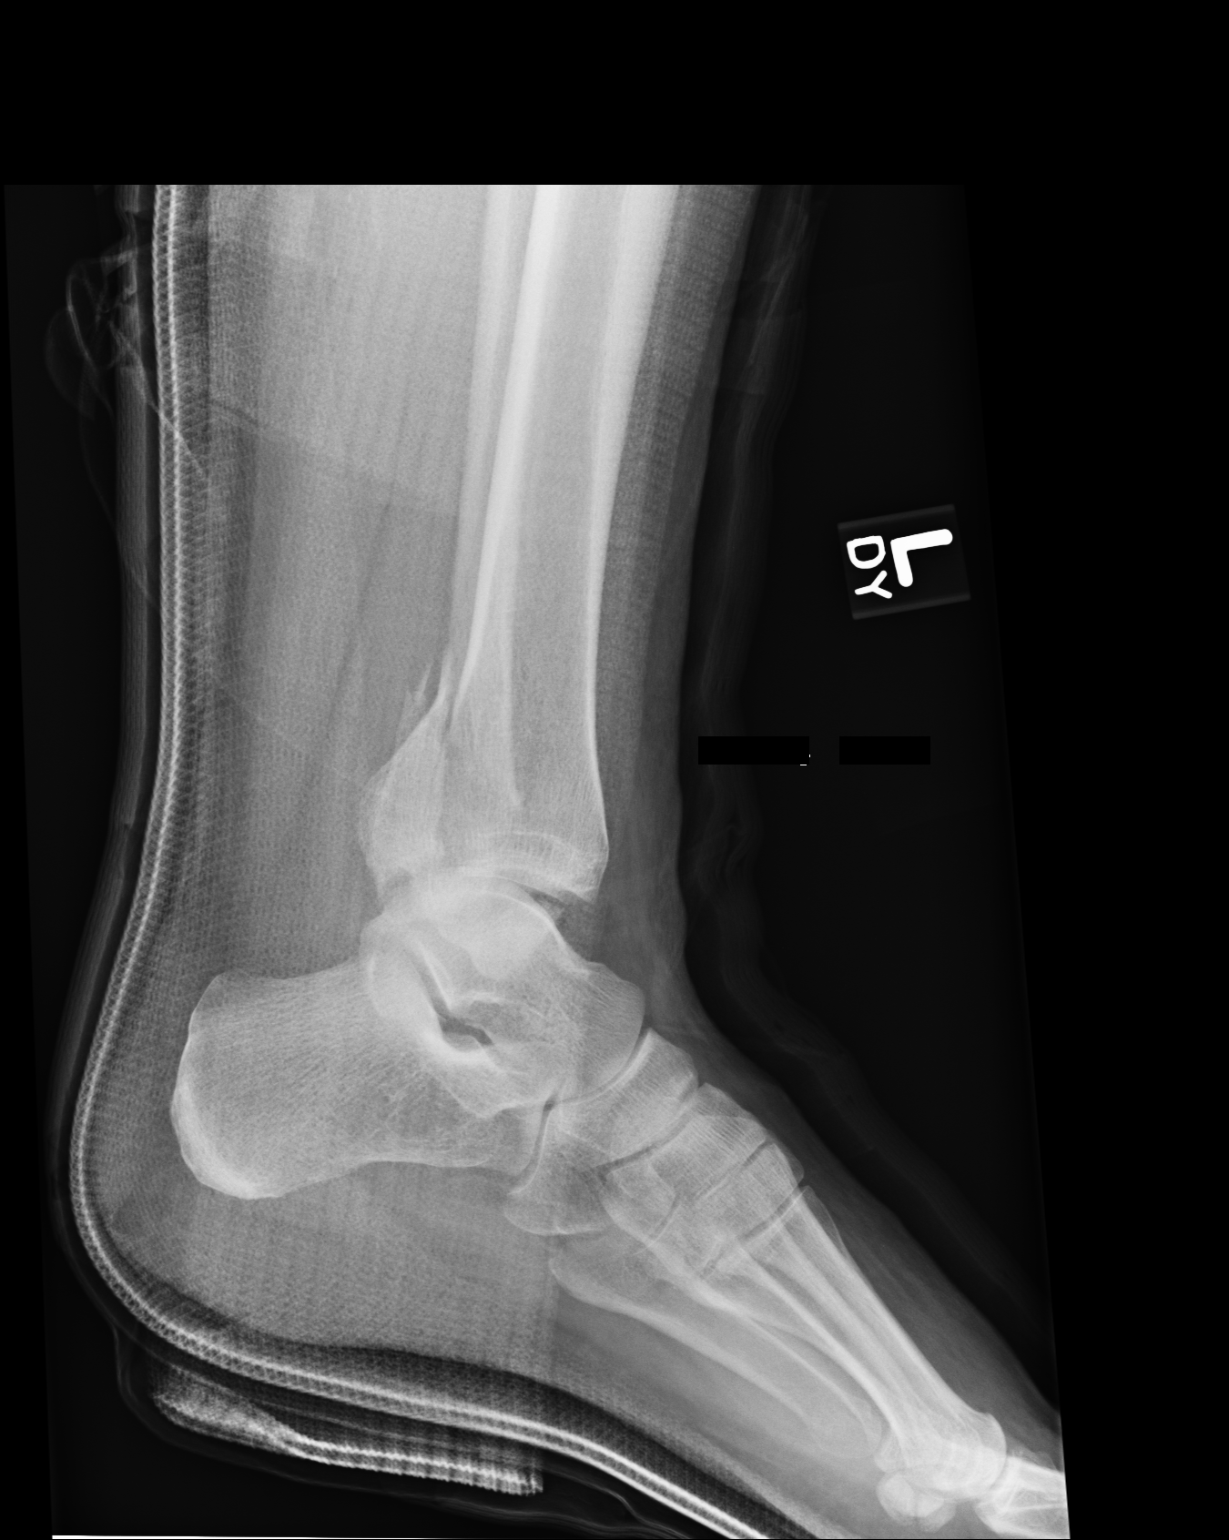

[3 of 3 positions shown; findings below may reference images not displayed]

PROCEDURE:     DXR - DXR ANKLE LEFT COMPLETE  - [DATE] [DATE]

RESULT:     Three views of the ankle were obtained. The previously present
trimalleolar fracture dislocation has been reduced. In the lateral view
there appears to be slight residual anterior subluxation of the tibia with
respect to the talus. A cast is now present.
IMPRESSION: 1.     Please see above.

## 2009-07-04 ENCOUNTER — Encounter: Payer: Self-pay | Admitting: Orthopedic Surgery

## 2009-07-07 ENCOUNTER — Encounter: Payer: Self-pay | Admitting: Orthopedic Surgery

## 2009-08-07 ENCOUNTER — Encounter: Payer: Self-pay | Admitting: Orthopedic Surgery

## 2009-09-06 ENCOUNTER — Encounter: Payer: Self-pay | Admitting: Orthopedic Surgery

## 2010-01-23 ENCOUNTER — Ambulatory Visit: Payer: Self-pay | Admitting: Obstetrics and Gynecology

## 2011-01-28 ENCOUNTER — Ambulatory Visit: Payer: Self-pay | Admitting: Obstetrics and Gynecology

## 2013-03-07 ENCOUNTER — Ambulatory Visit: Payer: Self-pay | Admitting: Obstetrics and Gynecology

## 2013-03-07 IMAGING — MG MM DIGITAL SCREENING BILAT W/ CAD
2 series · 5 of 5 positions shown · non-contrast
Comparison: Previous Exam(s)

CLINICAL DATA: Screening.

EXAM:
DIGITAL SCREENING BILATERAL MAMMOGRAM WITH CAD

[R CC · right · 4 of 4 slices shown]
[im 1/4]
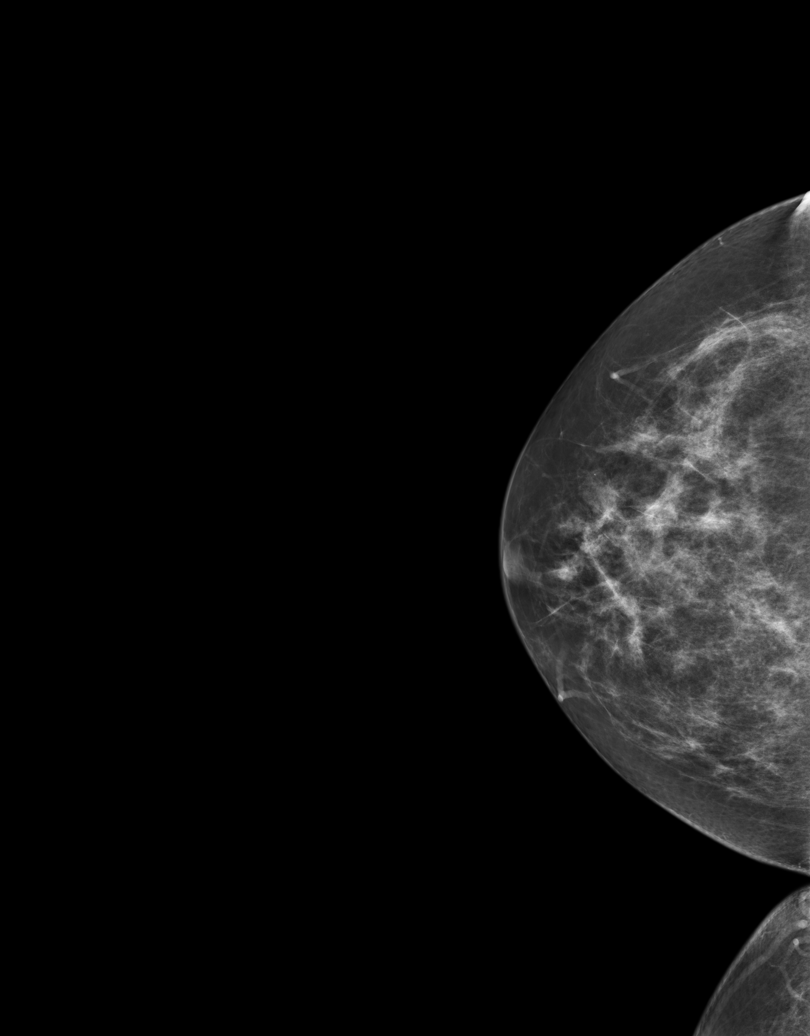
[im 2/4]
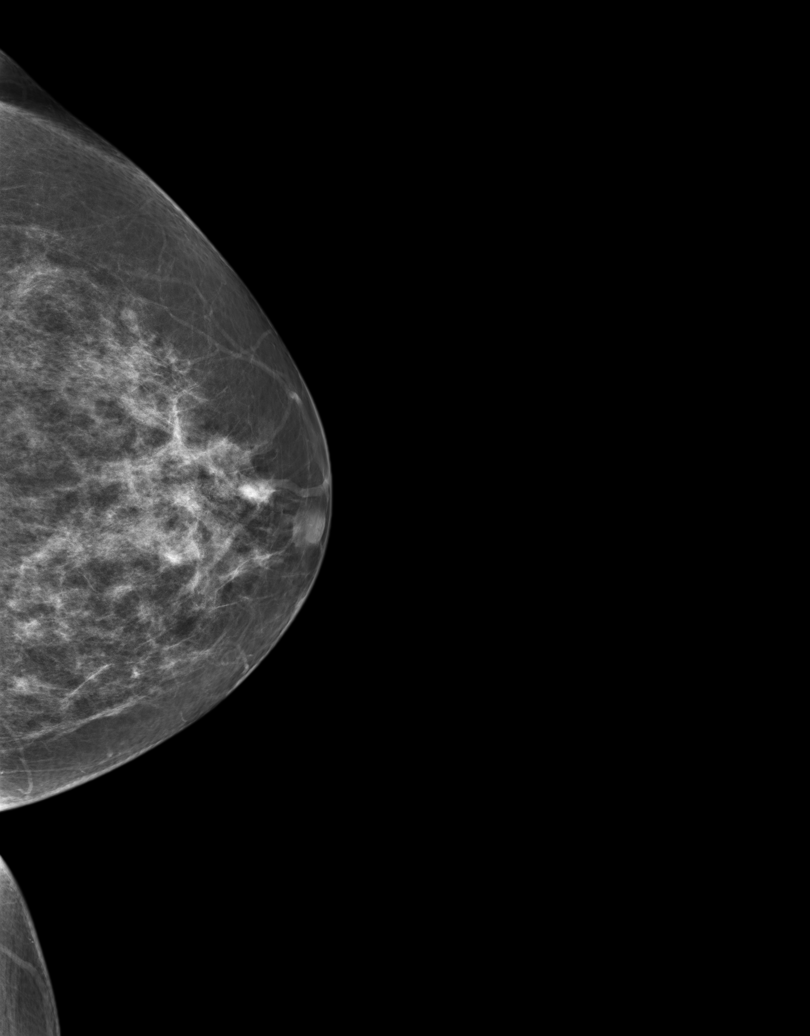
[im 3/4]
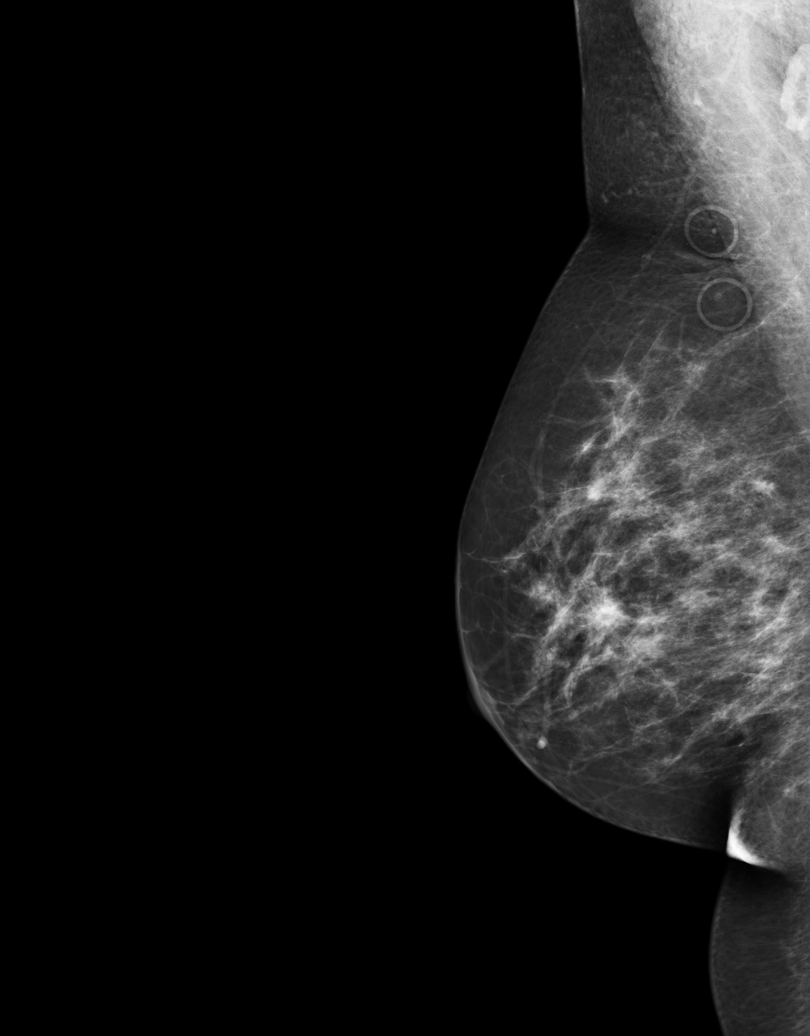
[im 4/4]
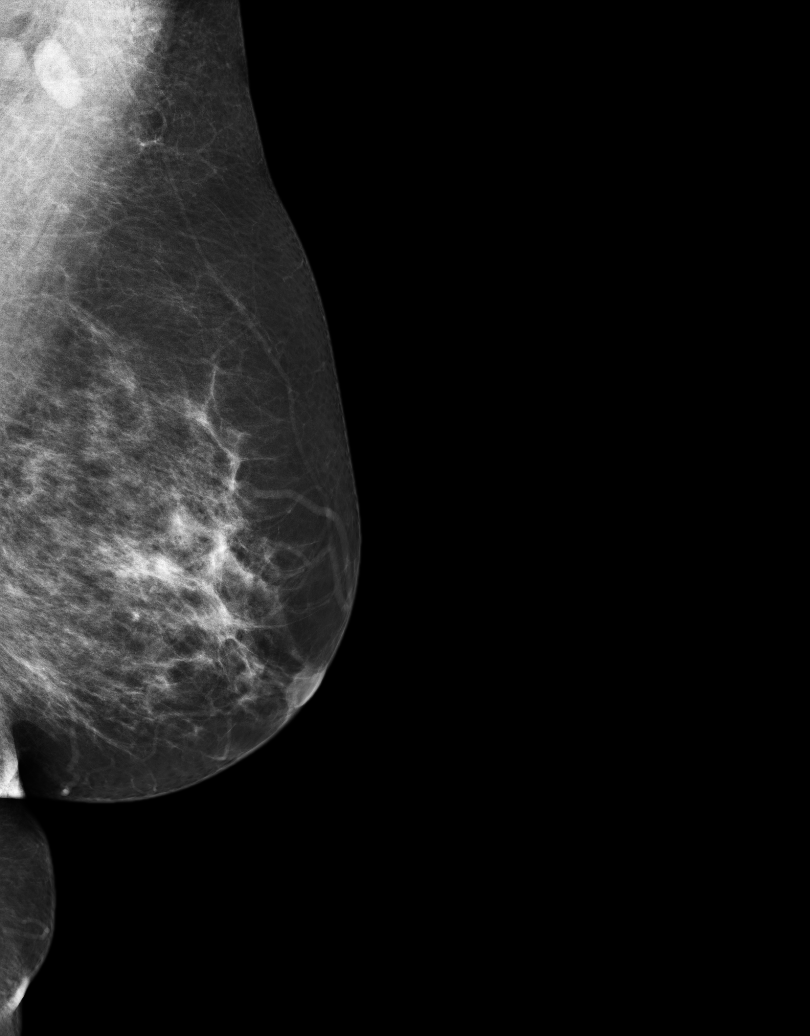

[R MLO]
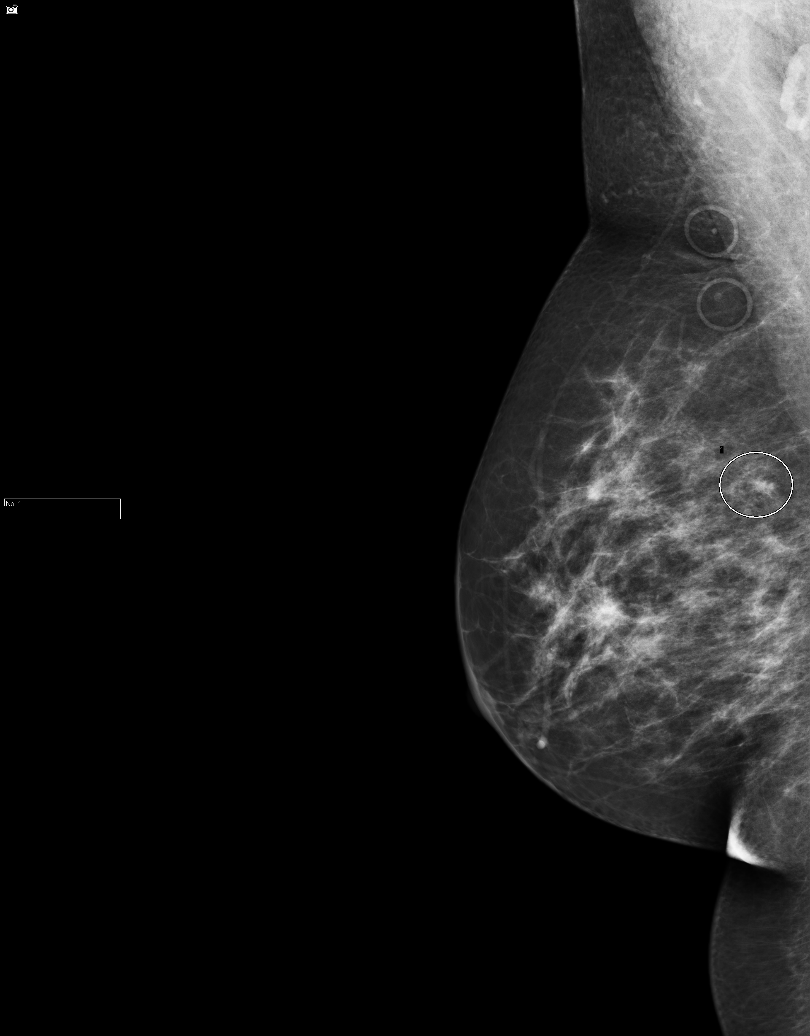

[5 of 5 positions shown; findings below may reference images not displayed]

ACR Breast Density Category c: The breasts are heterogeneously
dense, which may obscure small masses.
FINDINGS: In the right breast, a possible asymmetry warrants further
evaluation with spot compression views and possibly ultrasound. In
the left breast, no suspicious masses or malignant type
calcifications are identified. Images were processed with CAD.
IMPRESSION: Further evaluation is suggested for possible asymmetry in the right
breast.

RECOMMENDATION:
Diagnostic mammogram and possibly ultrasound of the right breast.
(Code:[9W])

The patient will be contacted regarding the findings, and additional
imaging will be scheduled.

BI-RADS CATEGORY  0: Incomplete. Need additional imaging evaluation
and/or prior mammograms for comparison.

## 2013-03-10 ENCOUNTER — Ambulatory Visit: Payer: Self-pay | Admitting: Obstetrics and Gynecology

## 2013-03-10 IMAGING — MG MM ADDITIONAL VIEWS AT NO CHARGE
1 series · 2 of 2 positions shown · non-contrast
Comparison: Priors

CLINICAL DATA: Patient recalled from screening for right breast
asymmetry

EXAM:
DIGITAL DIAGNOSTIC  RIGHT MAMMOGRAM WITH CAD

[R ML · right · 2 of 2 slices shown]
[im 1/2]
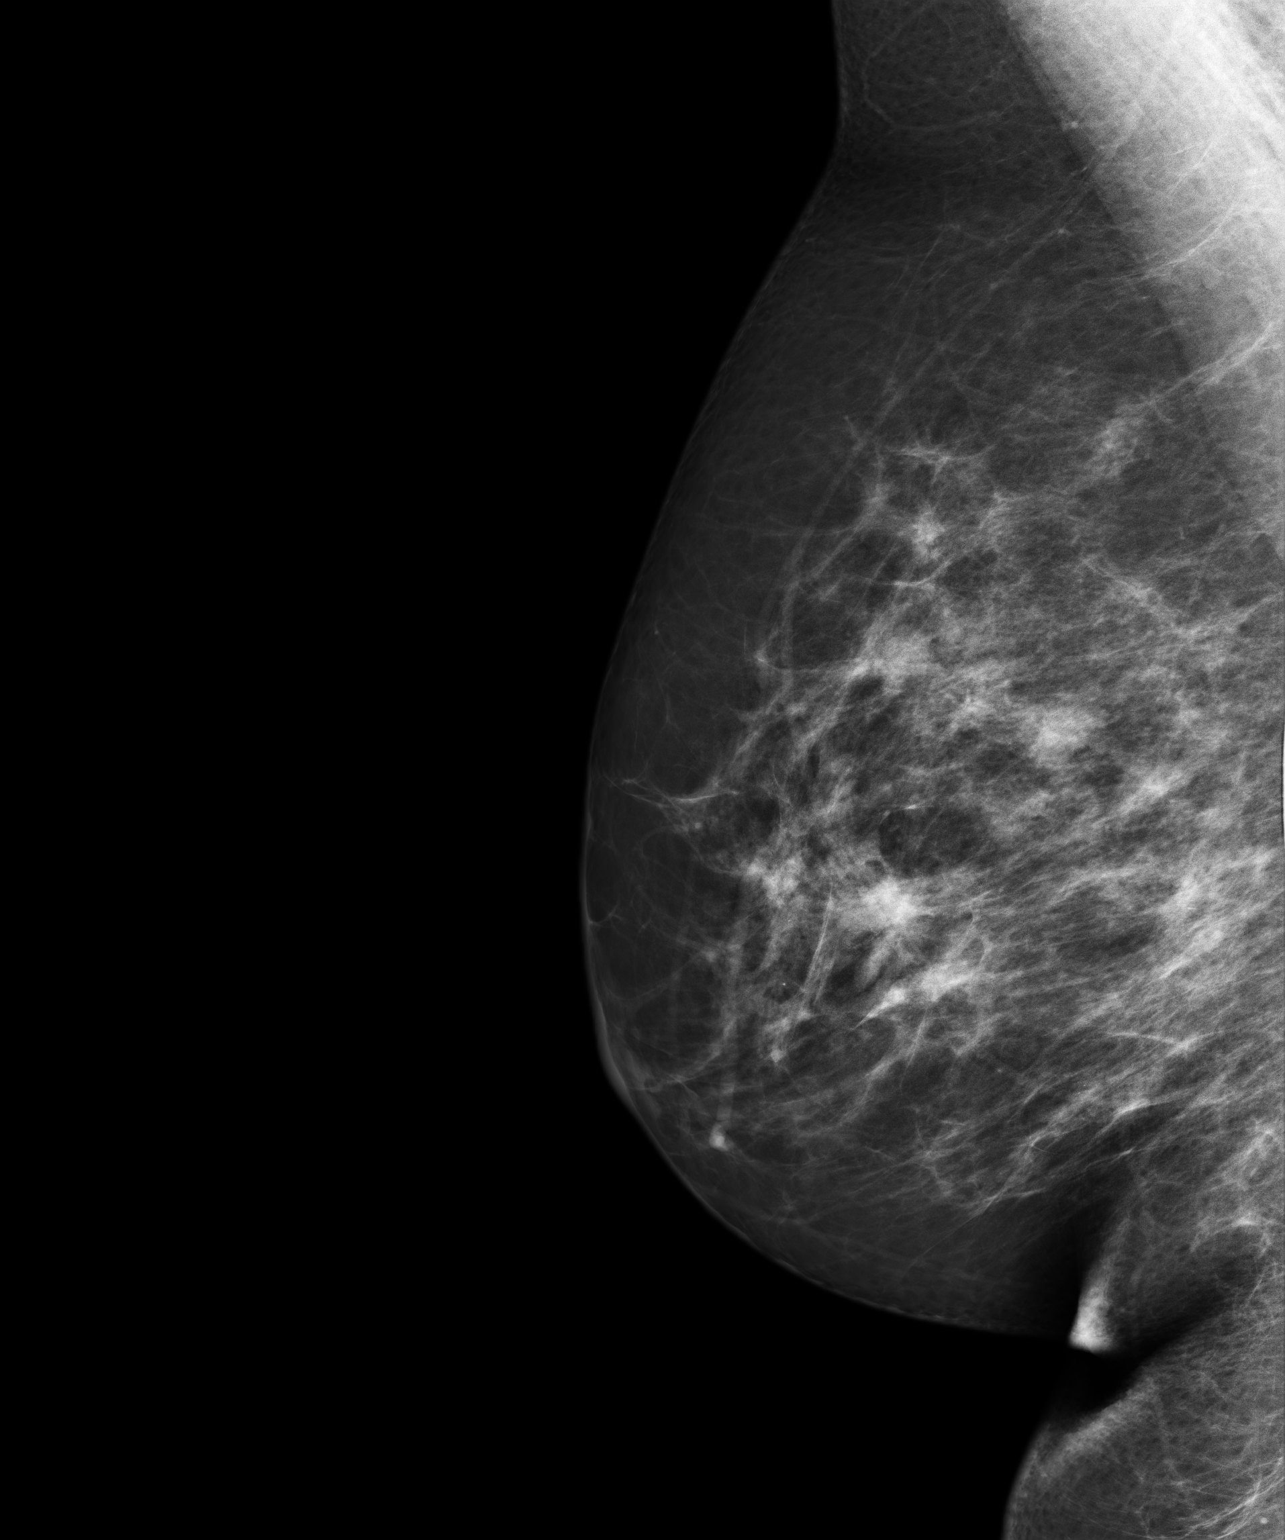
[im 2/2]
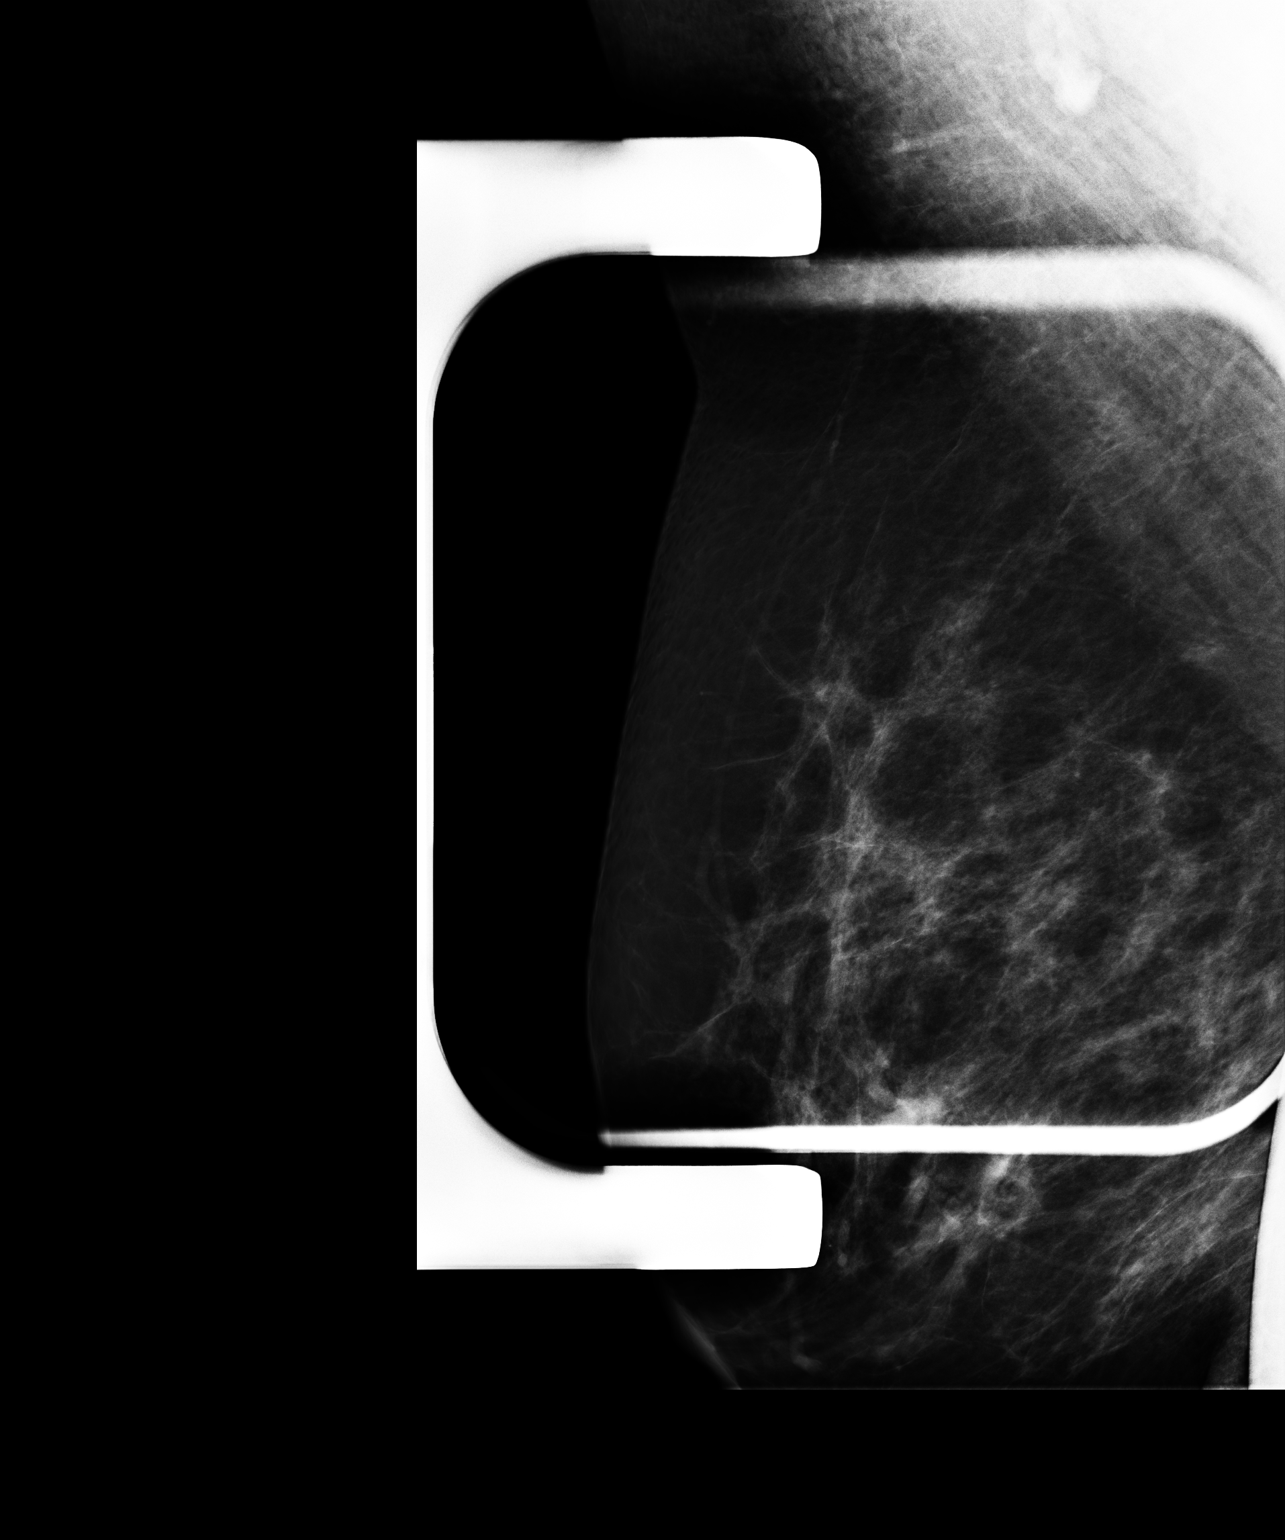

[2 of 2 positions shown; findings below may reference images not displayed]

ACR Breast Density Category c: The breasts are heterogeneously
dense, which may obscure small masses.
FINDINGS: Questioned asymmetry within the superior aspect posteriorly of the
right breast on the MLO view resolved with additional imaging,
compatible with overlapping fibroglandular breast tissue.

Mammographic images were processed with CAD.
IMPRESSION: No mammographic evidence for malignancy

RECOMMENDATION:
Screening mammogram in one year.(Code:[GM])

I have discussed the findings and recommendations with the patient.
Results were also provided in writing at the conclusion of the
visit. If applicable, a reminder letter will be sent to the patient
regarding the next appointment.

BI-RADS CATEGORY  1: Negative

## 2014-02-10 ENCOUNTER — Ambulatory Visit: Payer: Self-pay | Admitting: Emergency Medicine

## 2014-03-13 ENCOUNTER — Ambulatory Visit: Payer: Self-pay | Admitting: Obstetrics and Gynecology

## 2014-03-13 IMAGING — MG MM DIGITAL SCREENING BILAT W/ CAD
1 series · 4 of 4 positions shown · non-contrast
Comparison: Previous exam(s).

CLINICAL DATA: Screening.

EXAM:
DIGITAL SCREENING BILATERAL MAMMOGRAM WITH CAD

[R CC · right · 4 of 4 slices shown]
[im 1/4]
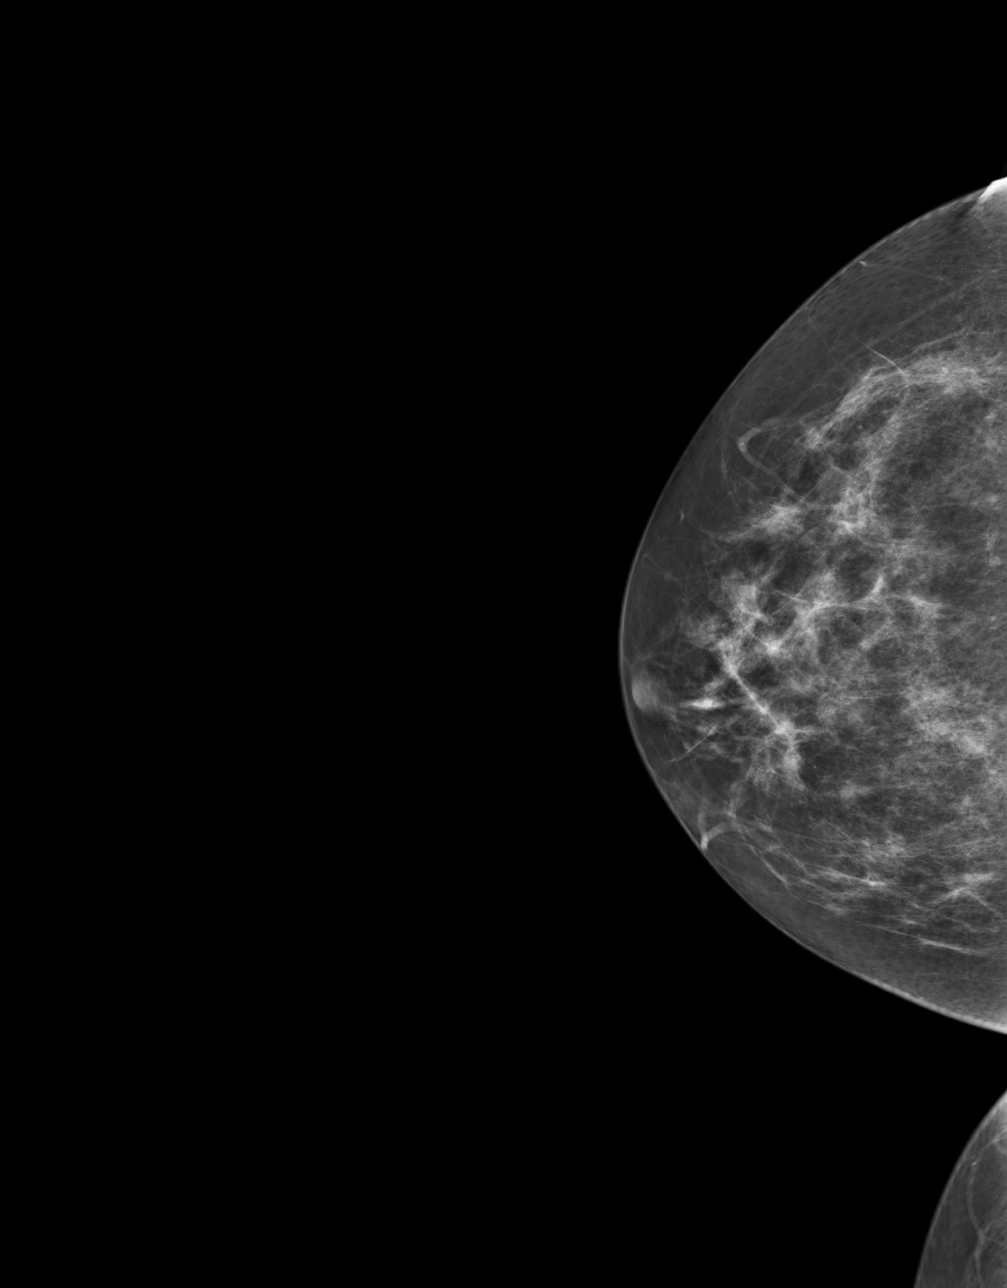
[im 2/4]
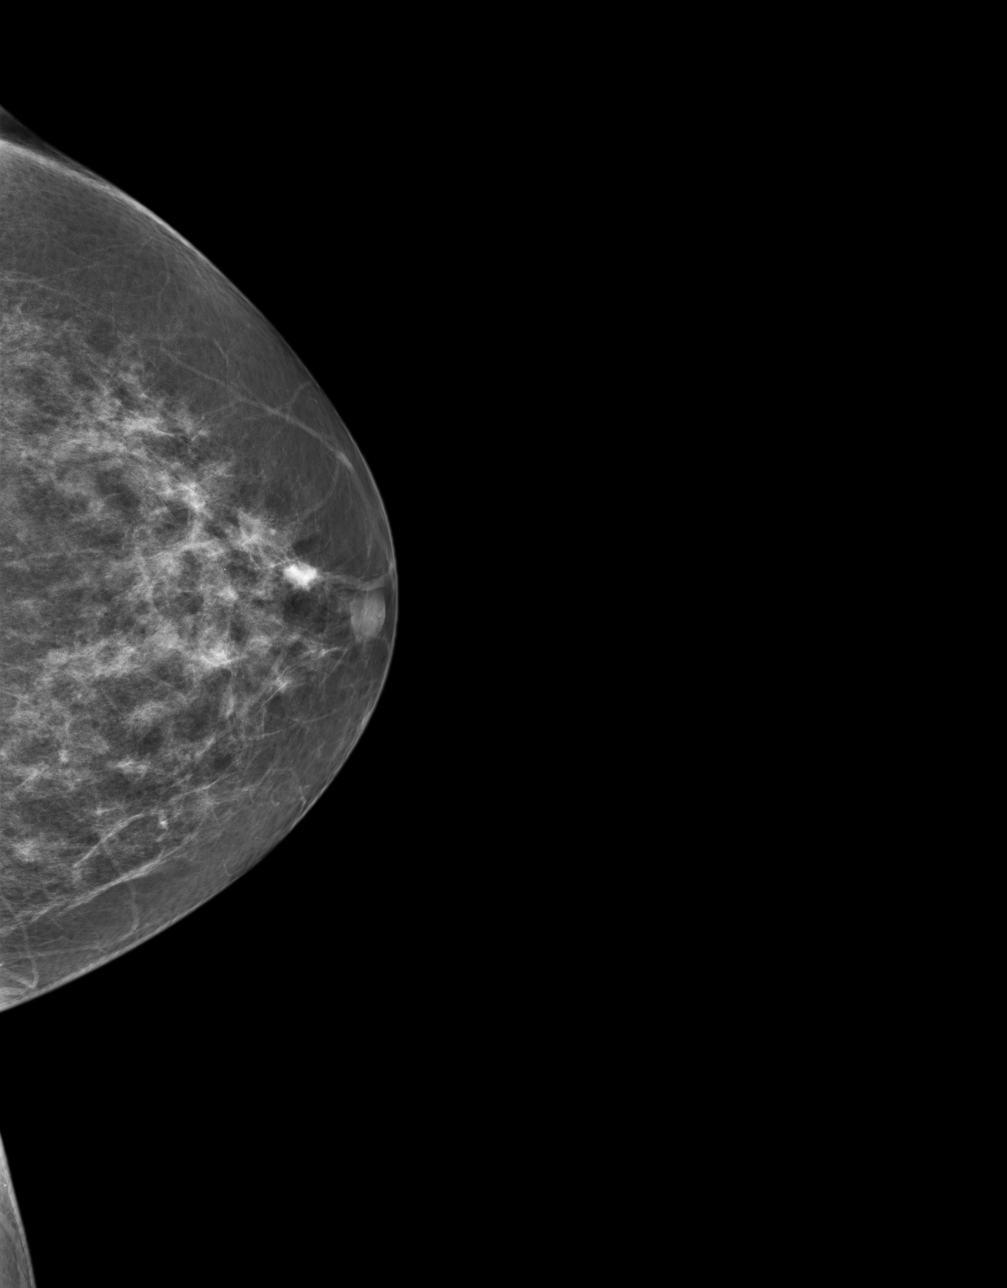
[im 3/4]
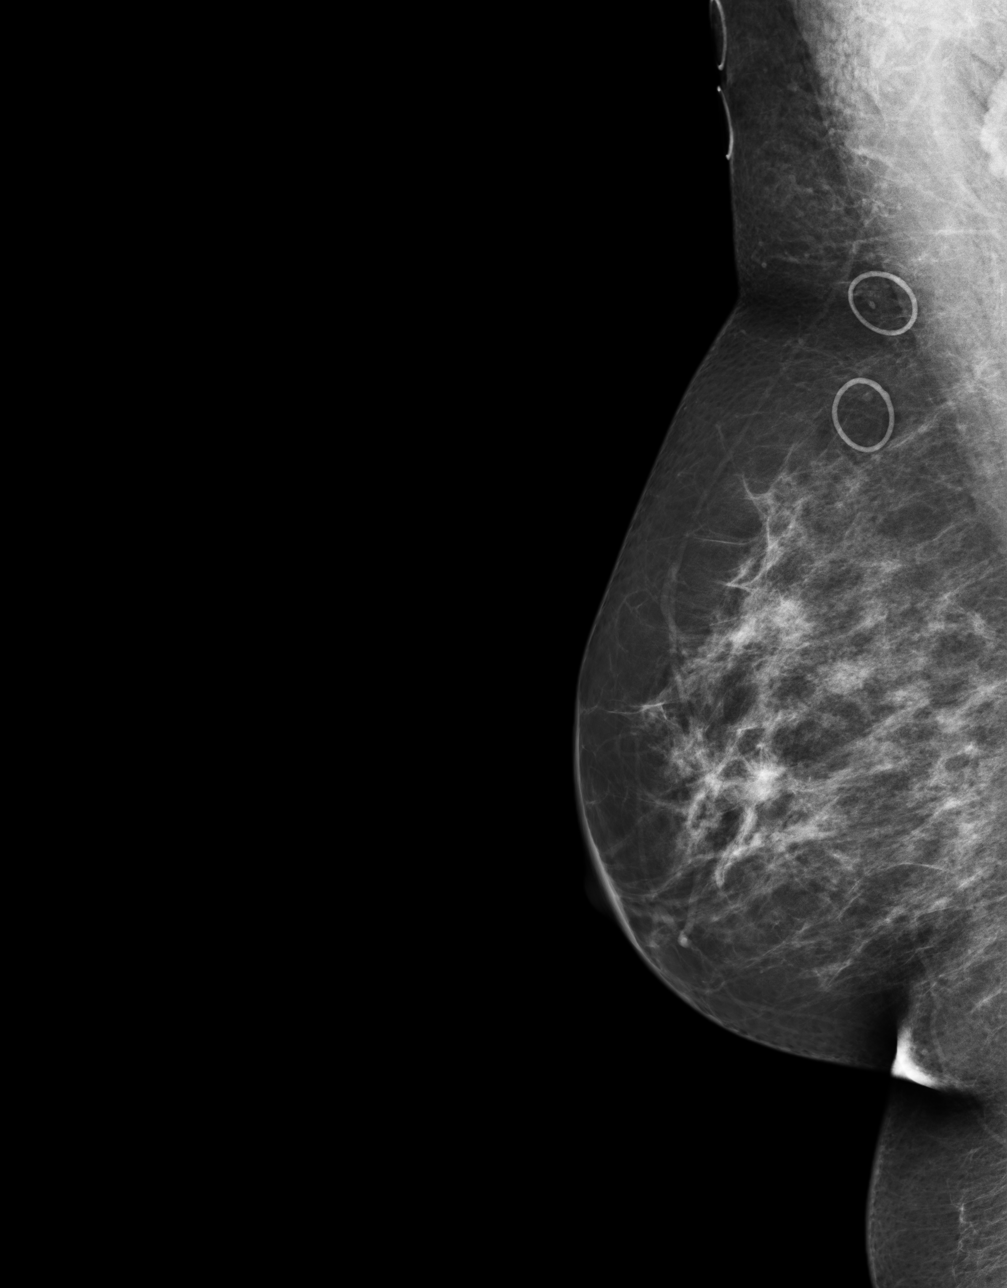
[im 4/4]
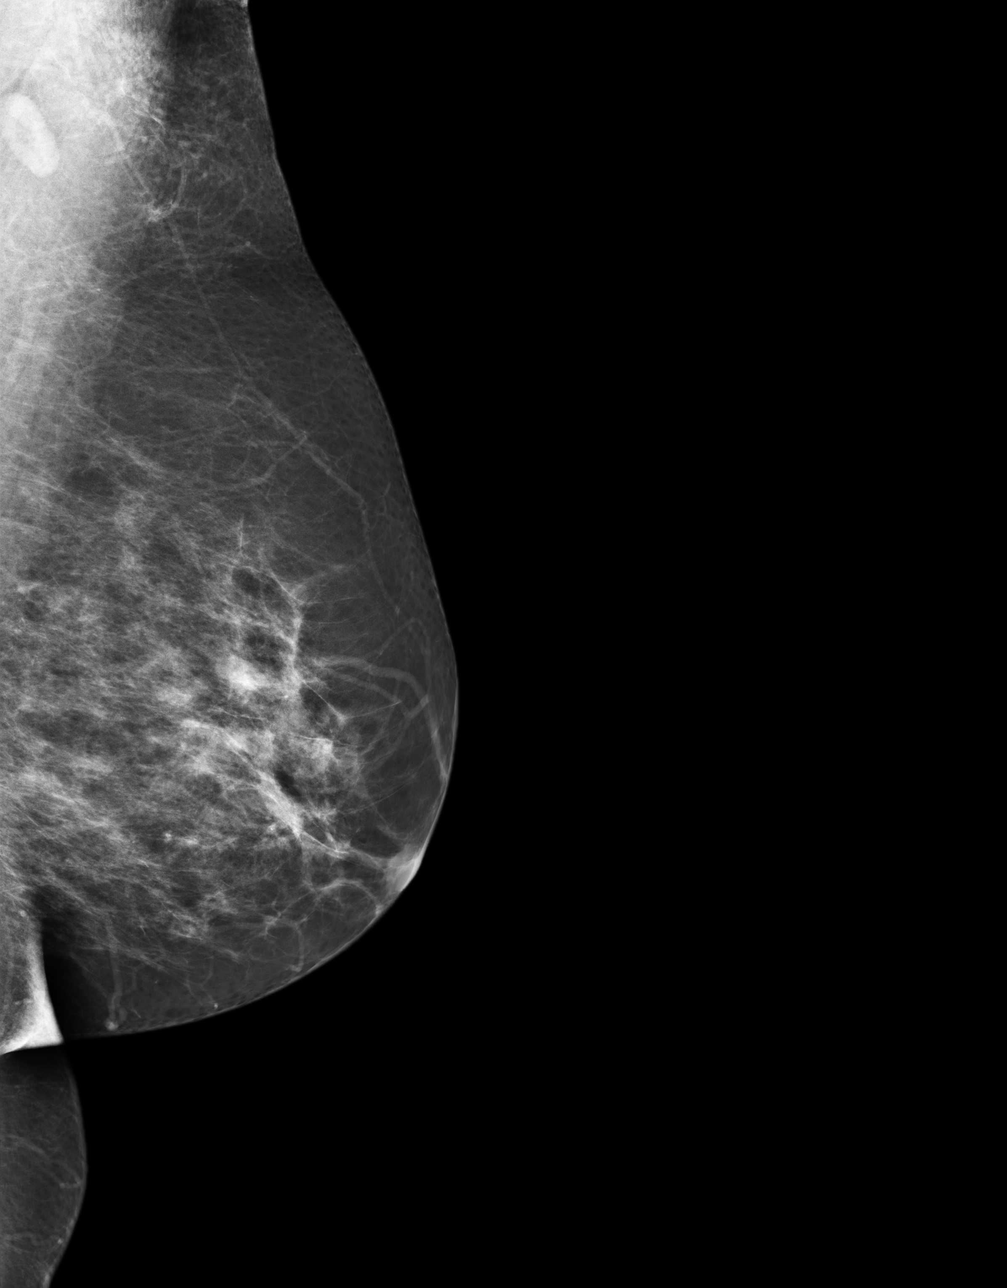

[4 of 4 positions shown; findings below may reference images not displayed]

ACR Breast Density Category b: There are scattered areas of
fibroglandular density.
FINDINGS: There are no findings suspicious for malignancy. Images were
processed with CAD.
IMPRESSION: No mammographic evidence of malignancy. A result letter of this
screening mammogram will be mailed directly to the patient.

RECOMMENDATION:
Screening mammogram in one year. (Code:[US])

BI-RADS CATEGORY  1: Negative.

## 2015-03-05 ENCOUNTER — Other Ambulatory Visit: Payer: Self-pay | Admitting: Physician Assistant

## 2015-03-05 DIAGNOSIS — Z1231 Encounter for screening mammogram for malignant neoplasm of breast: Secondary | ICD-10-CM

## 2015-03-19 ENCOUNTER — Ambulatory Visit
Admission: RE | Admit: 2015-03-19 | Discharge: 2015-03-19 | Disposition: A | Discharge status: Home / Self Care | Payer: BC Managed Care – PPO | Source: Ambulatory Visit | Attending: Physician Assistant | Admitting: Physician Assistant

## 2015-03-19 DIAGNOSIS — Z1231 Encounter for screening mammogram for malignant neoplasm of breast: Secondary | ICD-10-CM | POA: Diagnosis not present

## 2015-03-19 IMAGING — MG MM DIGITAL SCREENING BILAT W/ CAD
1 series · 4 of 4 positions shown · non-contrast
Comparison: Previous exam(s).

CLINICAL DATA: Screening.

EXAM:
DIGITAL SCREENING BILATERAL MAMMOGRAM WITH CAD

[R CC · right · 4 of 4 slices shown]
[im 1/4]
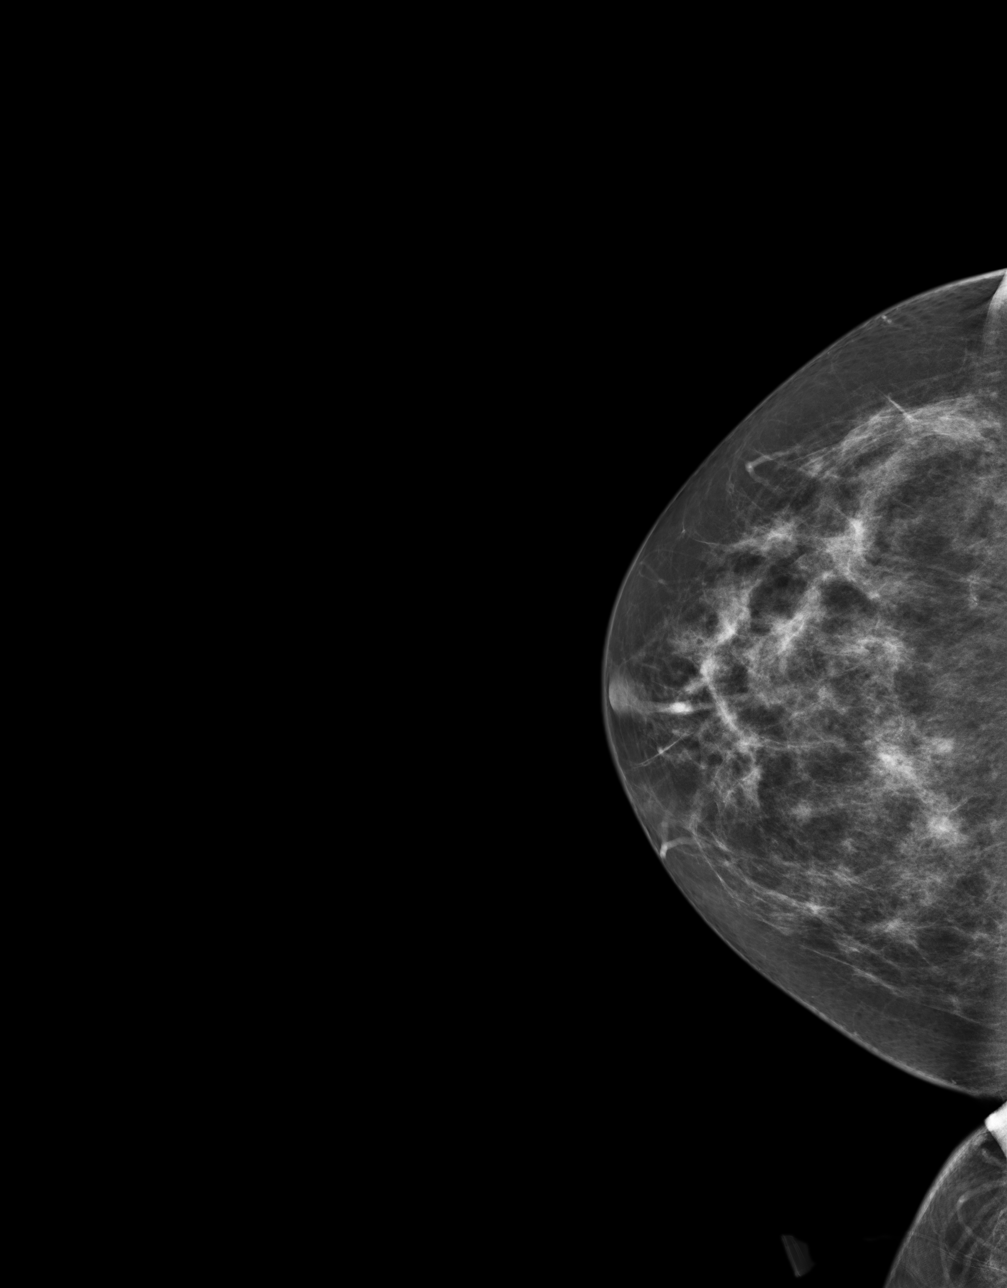
[im 2/4]
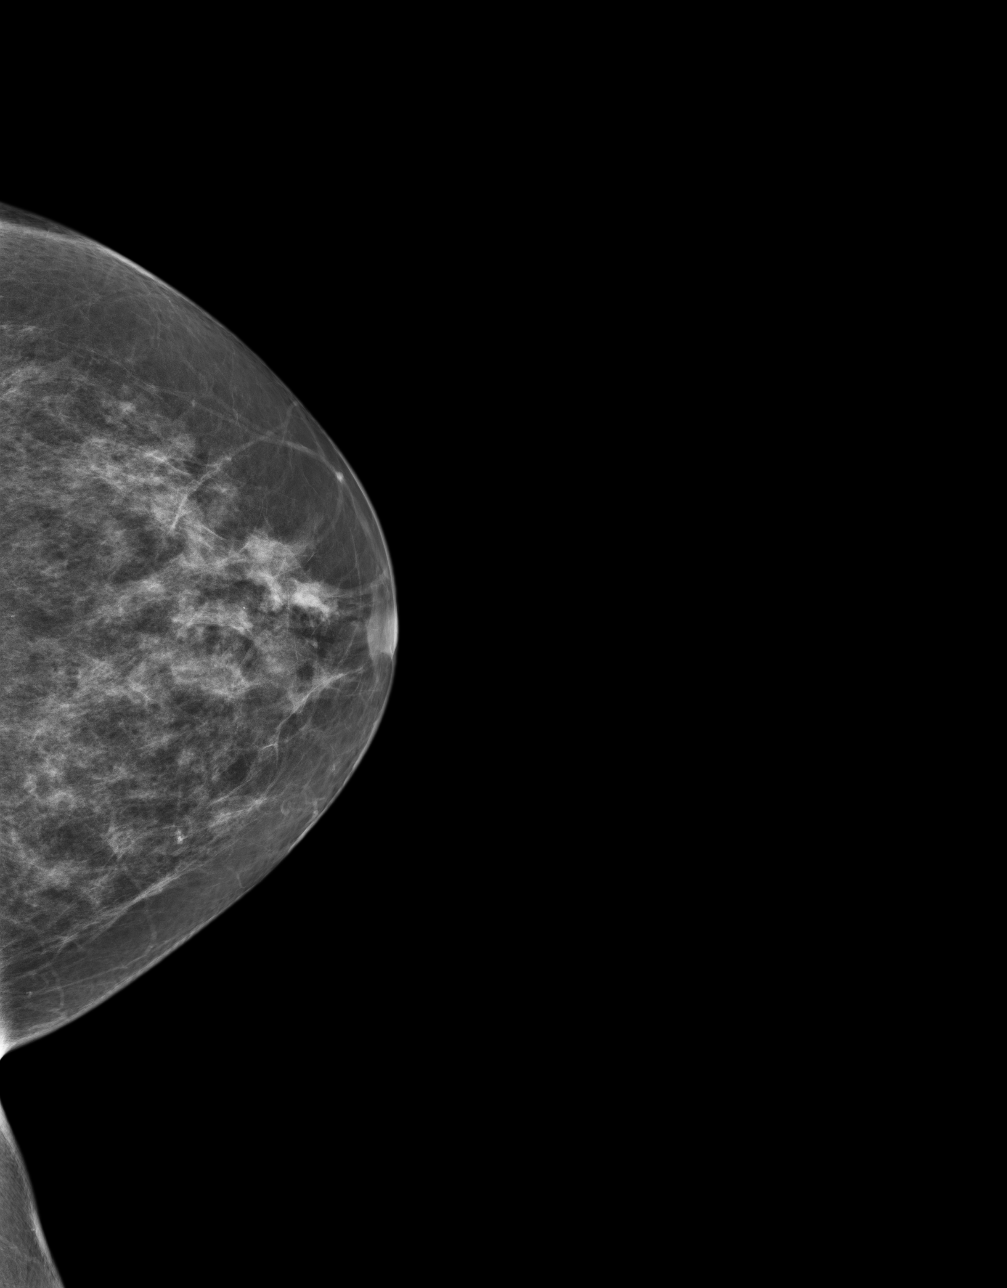
[im 3/4]
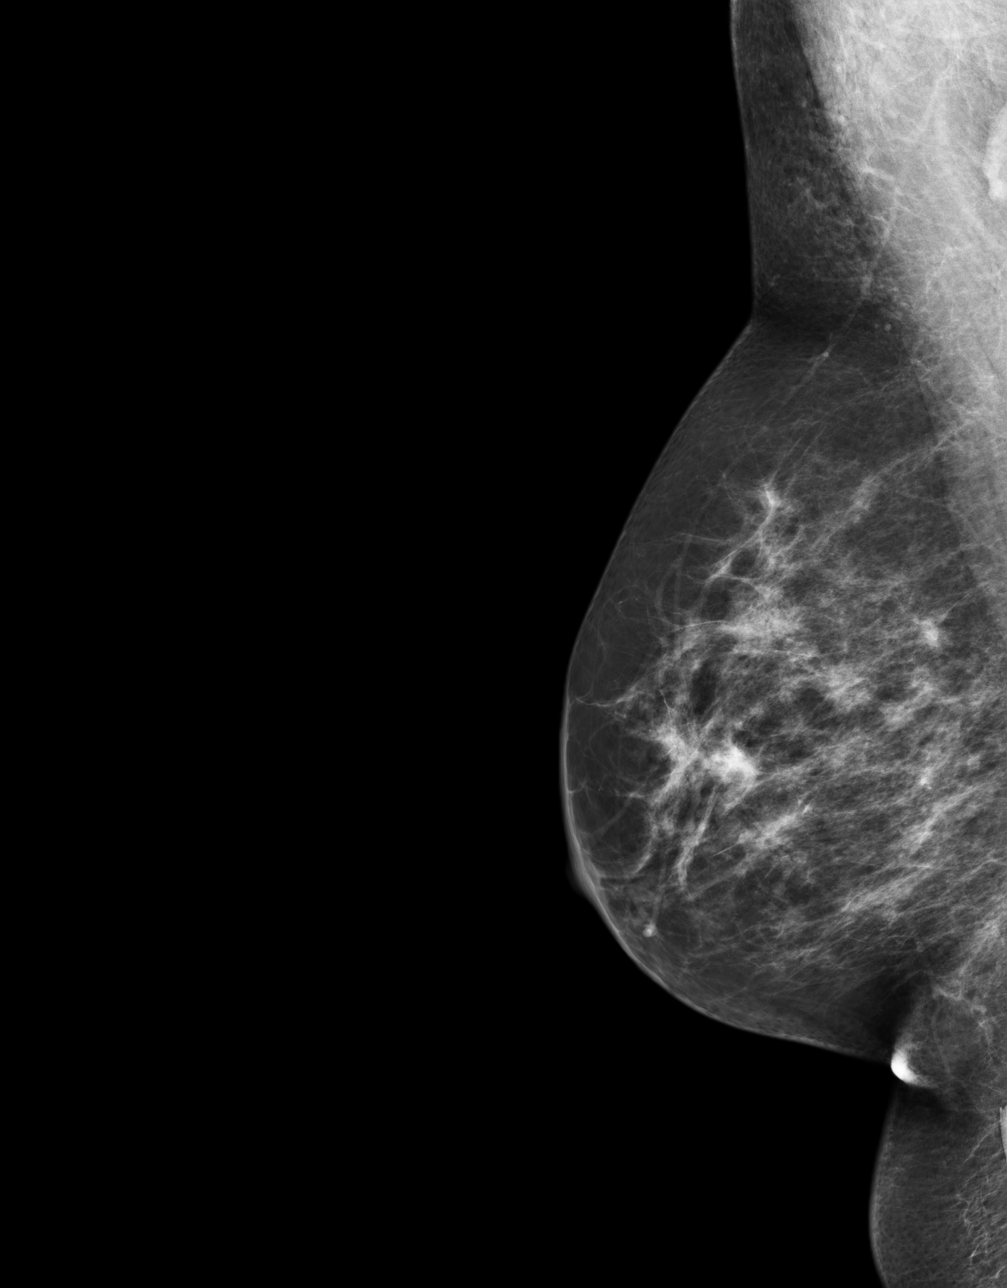
[im 4/4]
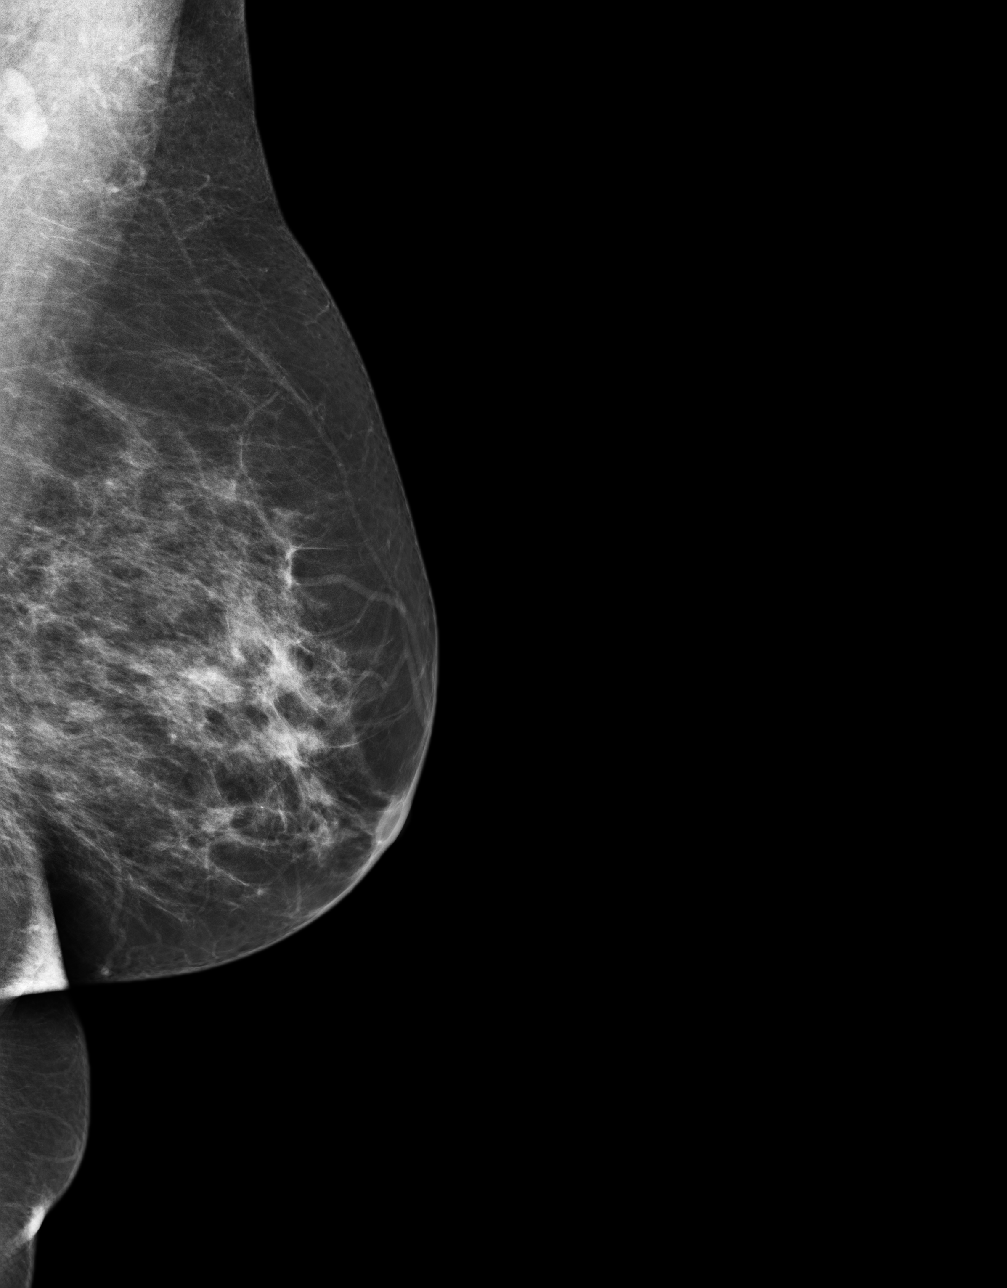

[4 of 4 positions shown; findings below may reference images not displayed]

ACR Breast Density Category c: The breast tissue is heterogeneously
dense, which may obscure small masses.
FINDINGS: There are no findings suspicious for malignancy. Images were
processed with CAD.
IMPRESSION: No mammographic evidence of malignancy. A result letter of this
screening mammogram will be mailed directly to the patient.

RECOMMENDATION:
Screening mammogram in one year. (Code:[0J])

BI-RADS CATEGORY  1: Negative.

## 2016-03-16 ENCOUNTER — Other Ambulatory Visit: Payer: Self-pay | Admitting: Physician Assistant

## 2016-03-16 DIAGNOSIS — Z1231 Encounter for screening mammogram for malignant neoplasm of breast: Secondary | ICD-10-CM

## 2016-03-23 ENCOUNTER — Ambulatory Visit
Admission: RE | Admit: 2016-03-23 | Discharge: 2016-03-23 | Disposition: A | Discharge status: Home / Self Care | Payer: BC Managed Care – PPO | Source: Ambulatory Visit | Attending: Physician Assistant | Admitting: Physician Assistant

## 2016-03-23 DIAGNOSIS — Z1231 Encounter for screening mammogram for malignant neoplasm of breast: Secondary | ICD-10-CM | POA: Diagnosis present

## 2016-03-23 IMAGING — MG MM DIGITAL SCREENING BILAT W/ TOMO W/ CAD
8 of 13 series · 8 of 29 positions shown · non-contrast
Comparison: Previous exam(s).

CLINICAL DATA: Screening.

EXAM:
2D DIGITAL SCREENING BILATERAL MAMMOGRAM WITH CAD AND ADJUNCT TOMO

[R MLO (1 of 2)]
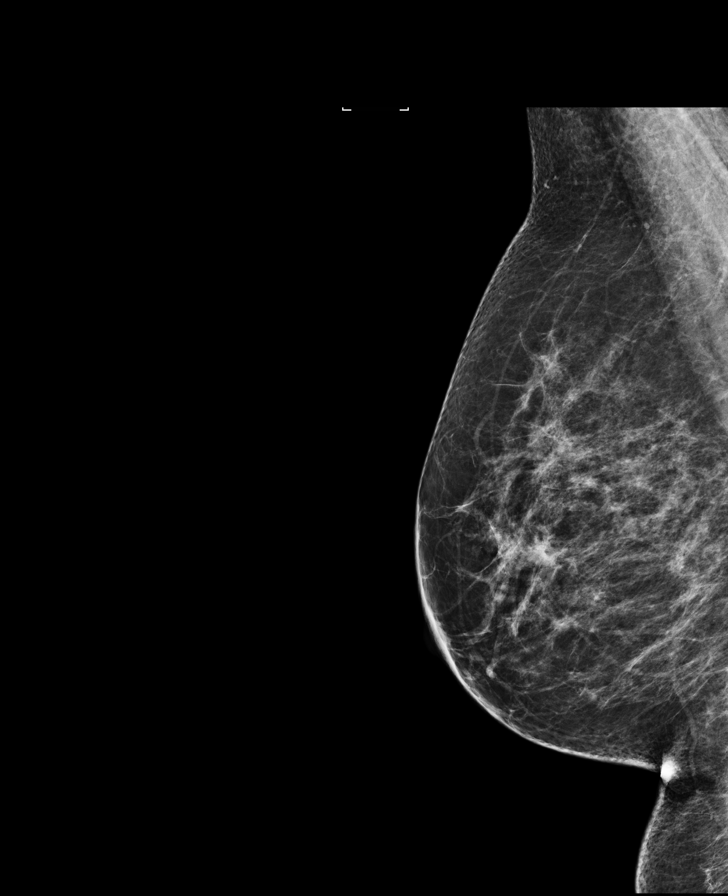

[L CC synth-2D]
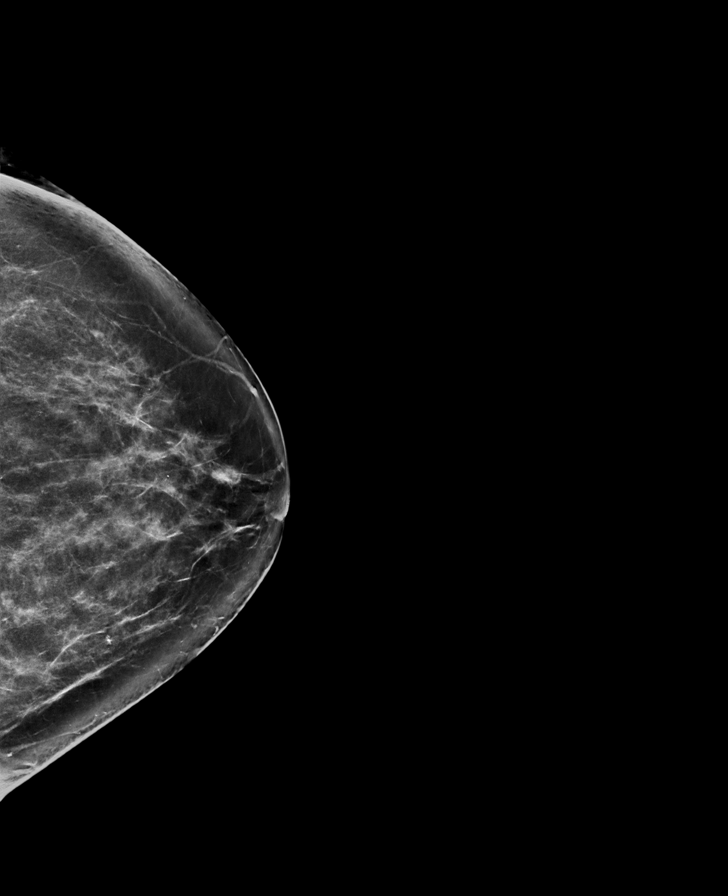

[L MLO]
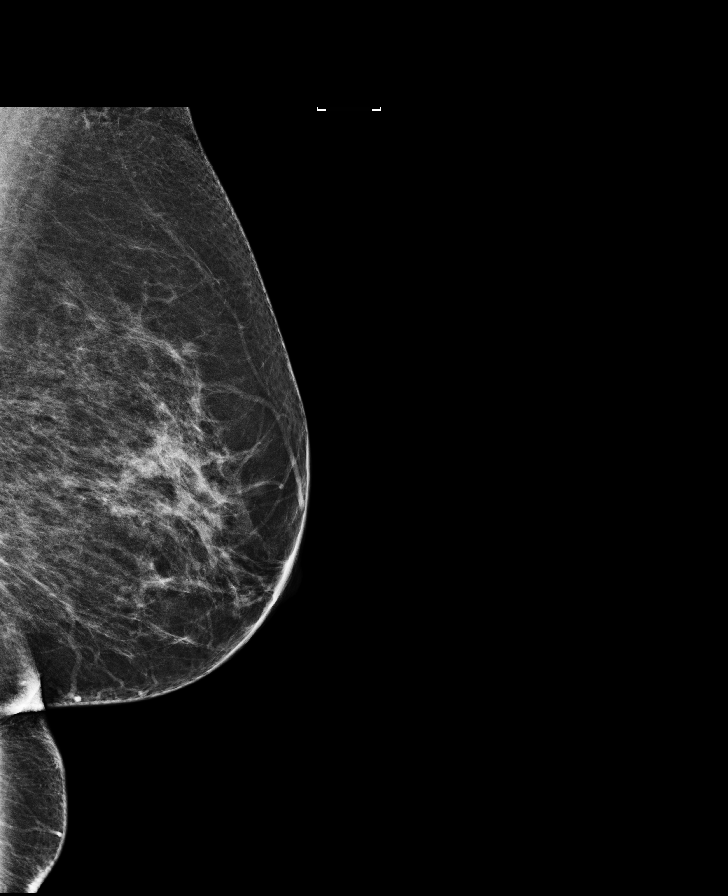

[R CC]
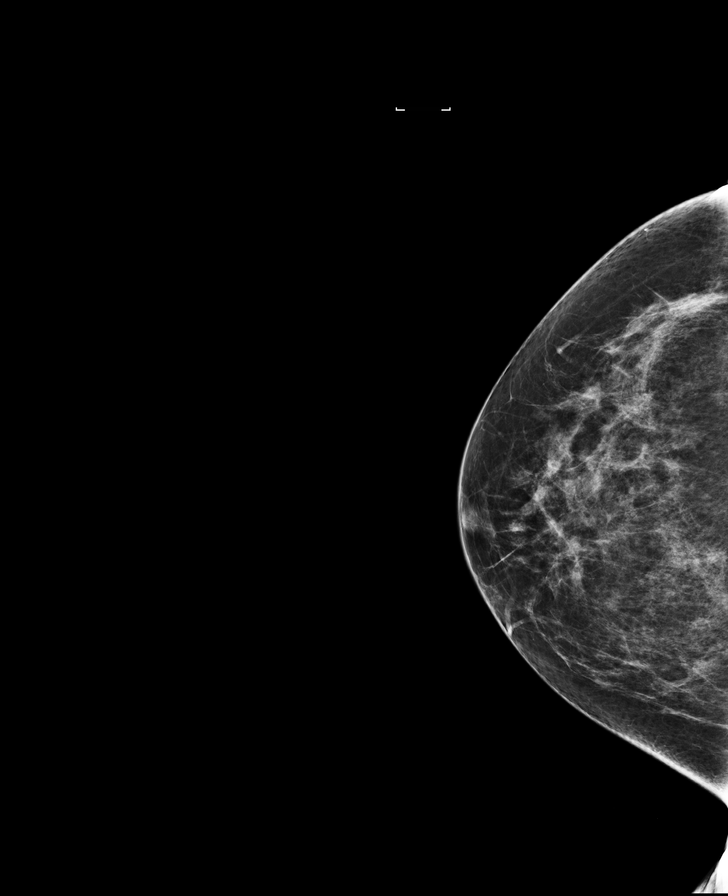

[R MLO synth-2D]
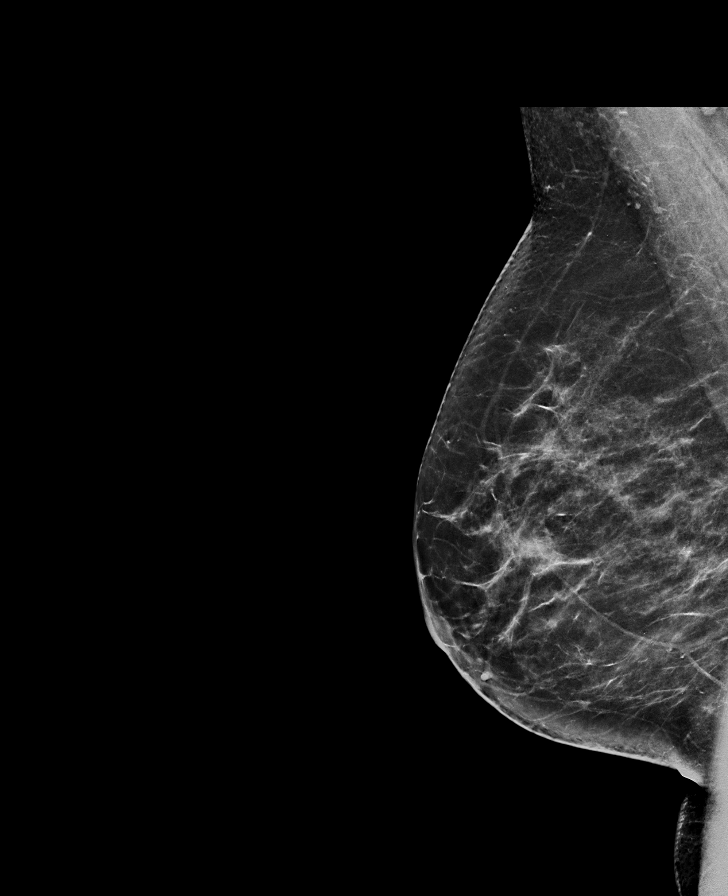

[R MLO (2 of 2)]
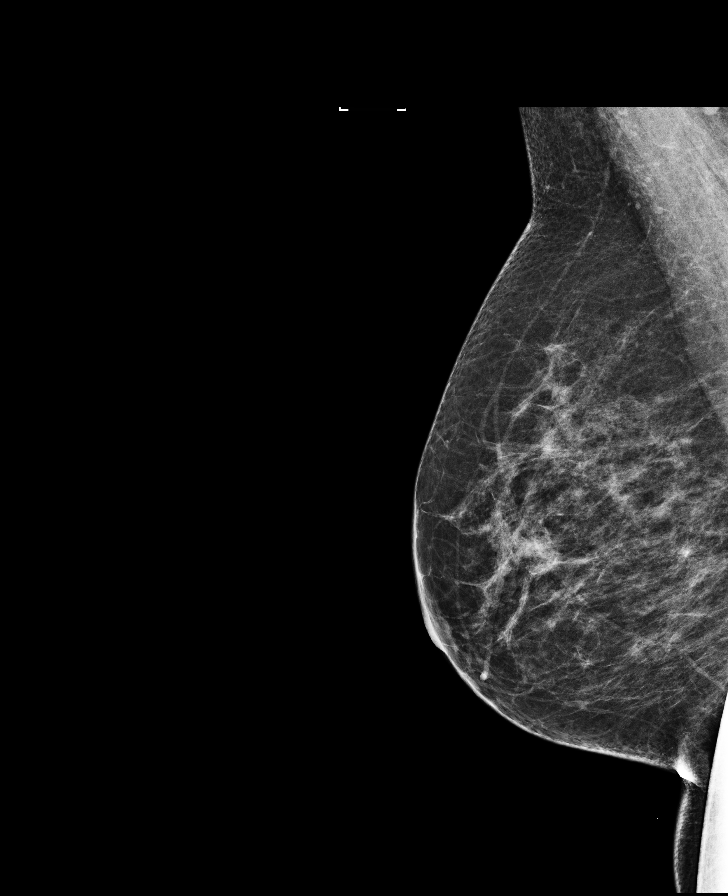

[L MLO synth-2D]
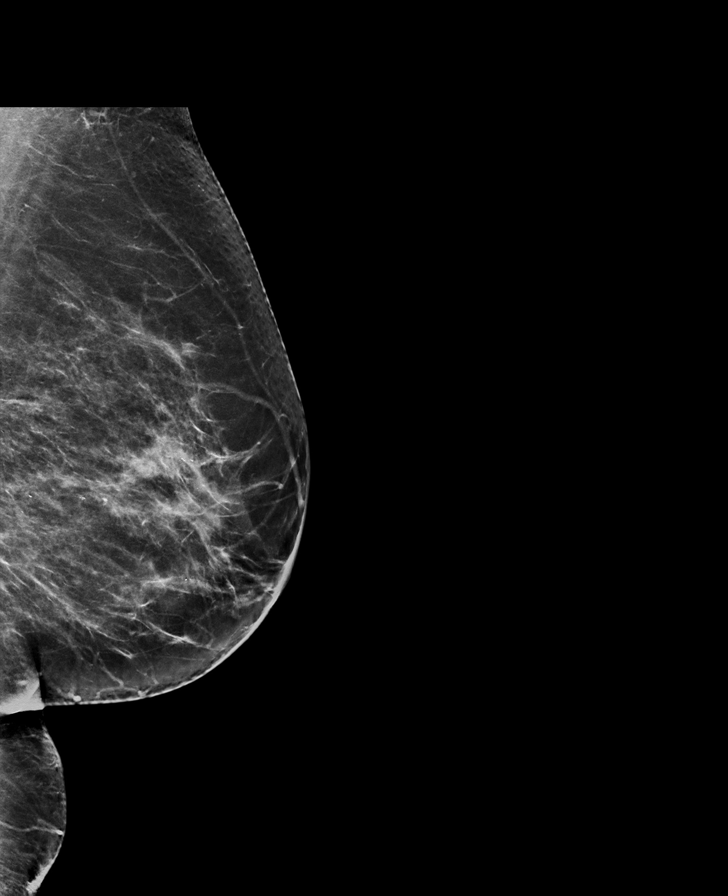

[L CC]
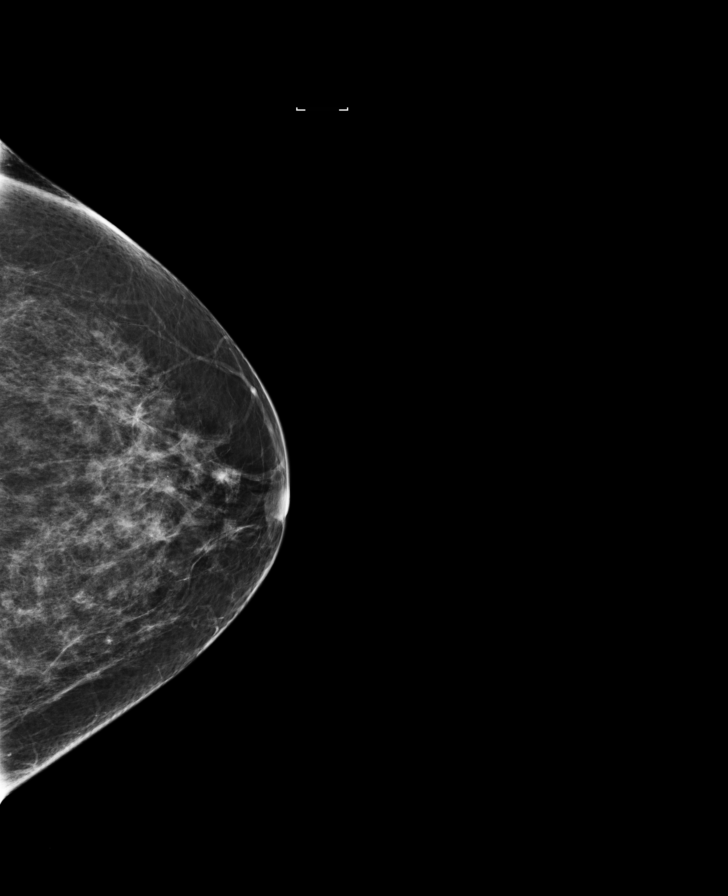

[8 of 29 positions shown; findings below may reference images not displayed]

ACR Breast Density Category c: The breast tissue is heterogeneously
dense, which may obscure small masses.
FINDINGS: There are no findings suspicious for malignancy. Images were
processed with CAD.
IMPRESSION: No mammographic evidence of malignancy. A result letter of this
screening mammogram will be mailed directly to the patient.

RECOMMENDATION:
Screening mammogram in one year. (Code:[TA])

BI-RADS CATEGORY  1: Negative.

## 2016-05-23 IMAGING — US US ABDOMEN LIMITED
1 series · 13 of 25 positions shown · non-contrast
Comparison: None.

CLINICAL DATA: Elevated LFTs.

EXAM:
ULTRASOUND ABDOMEN LIMITED RIGHT UPPER QUADRANT

[Series 1: us abdomen limited · 0.19mm/px · 13 of 59 slices shown]
[im 1/59]
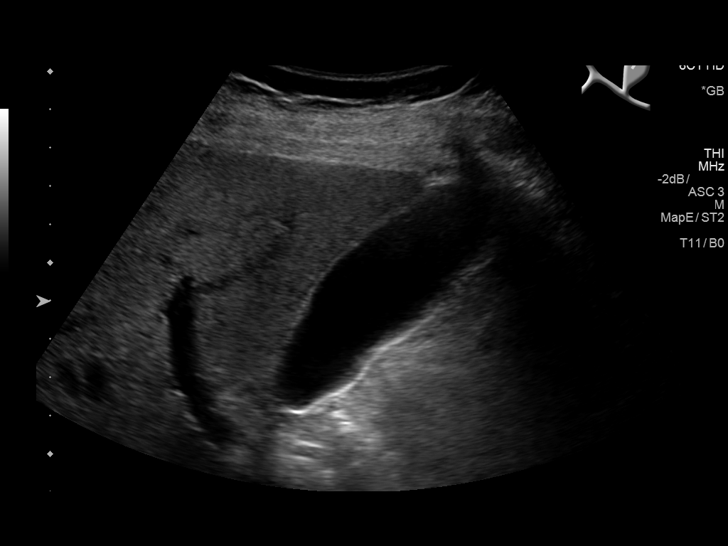
[im 5/59]
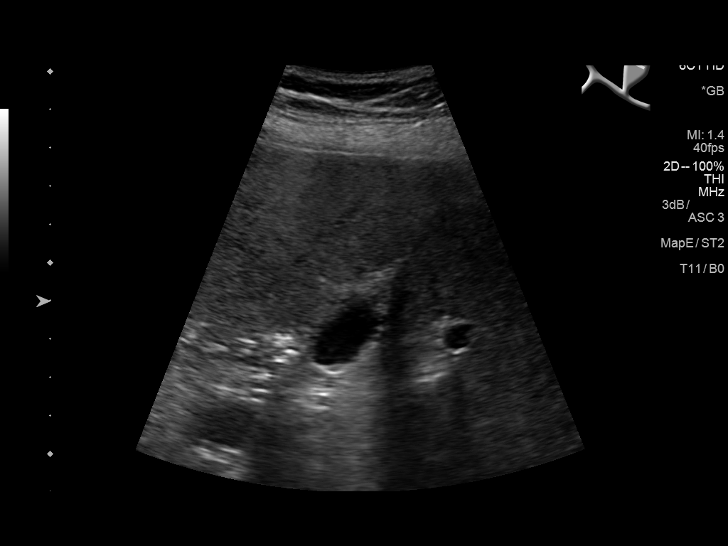
[im 10/59]
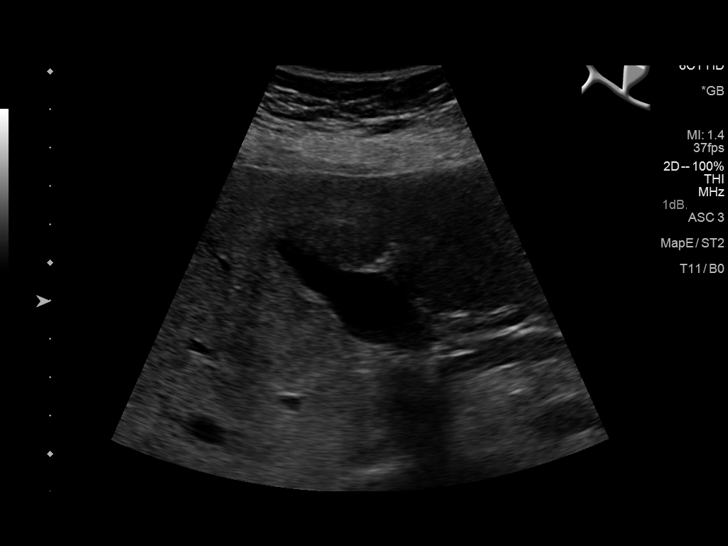
[im 15/59]
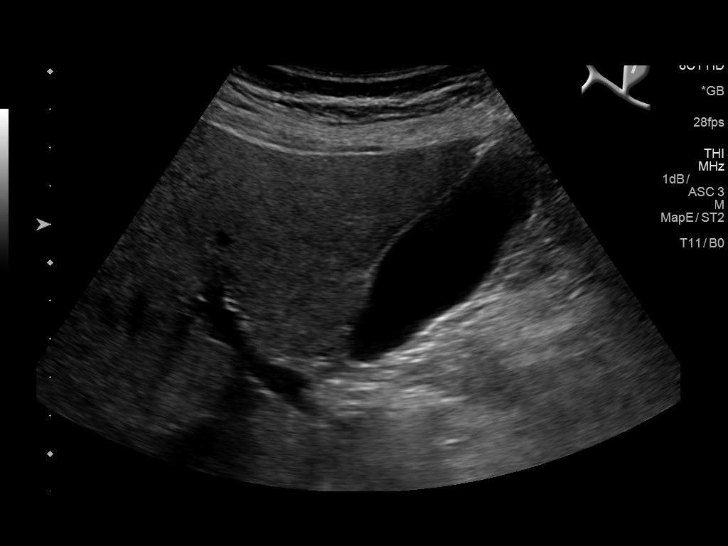
[im 20/59]
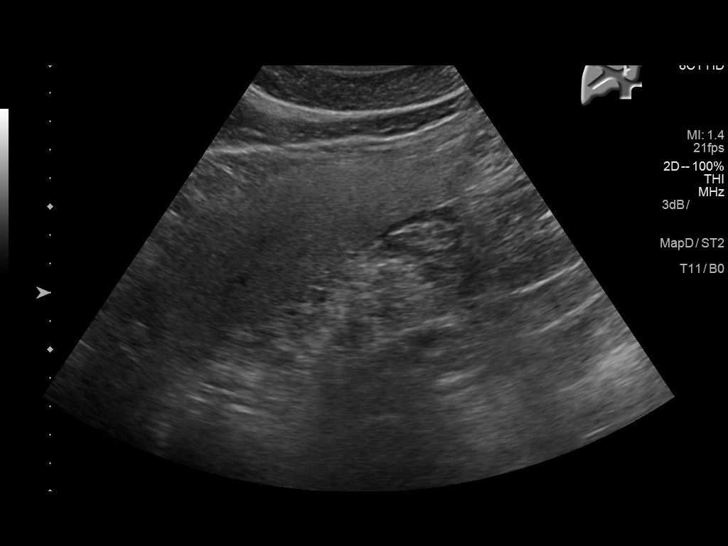
[im 25/59]
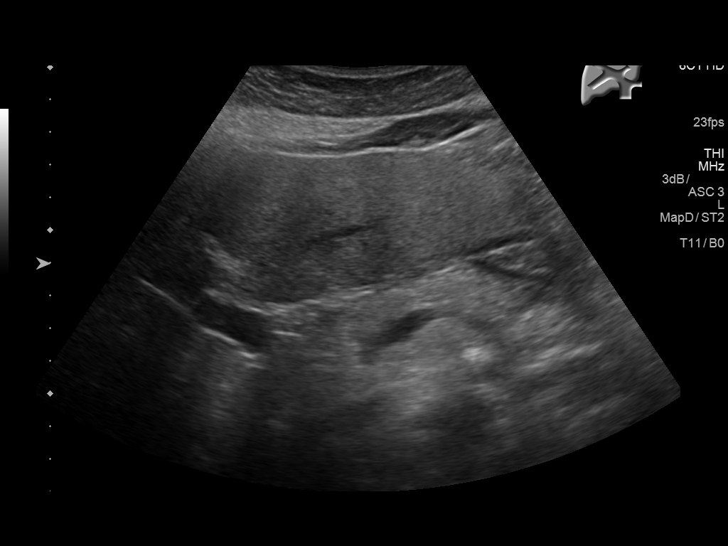
[im 30/59]
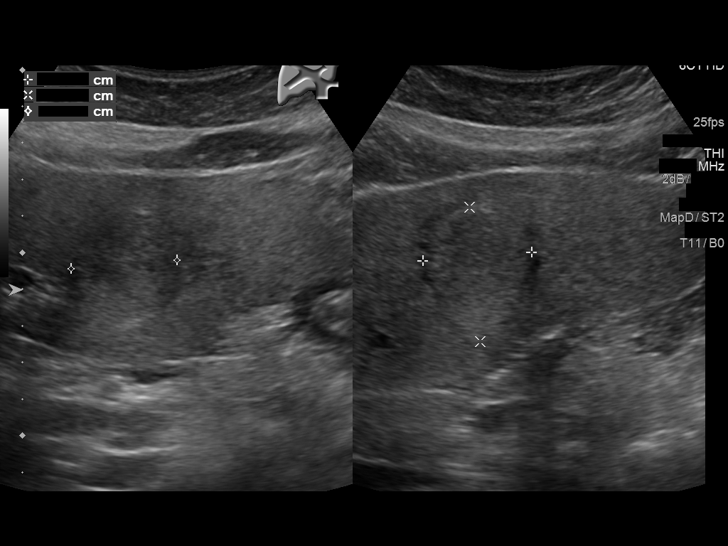
[im 34/59]
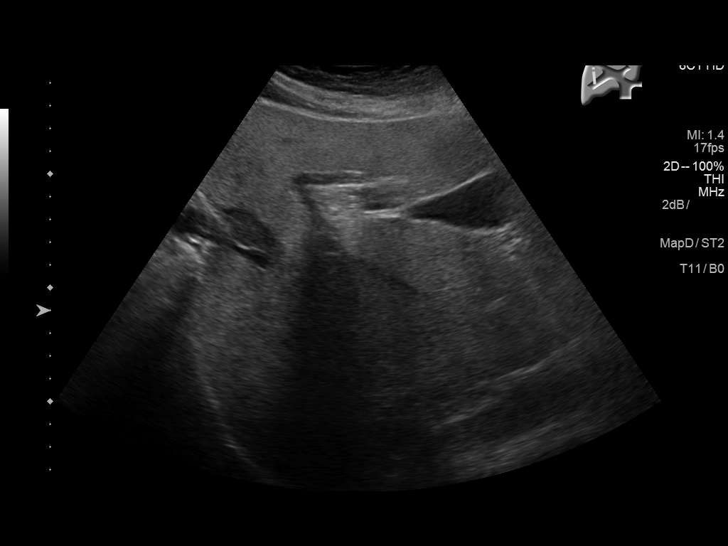
[im 39/59]
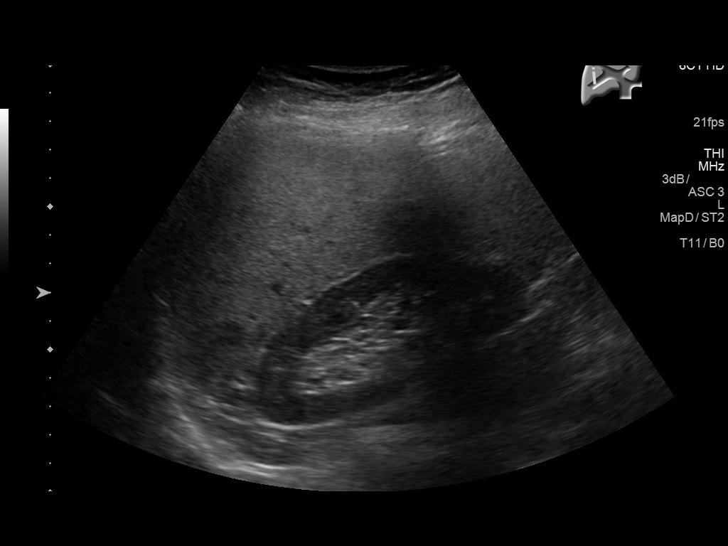
[im 44/59]
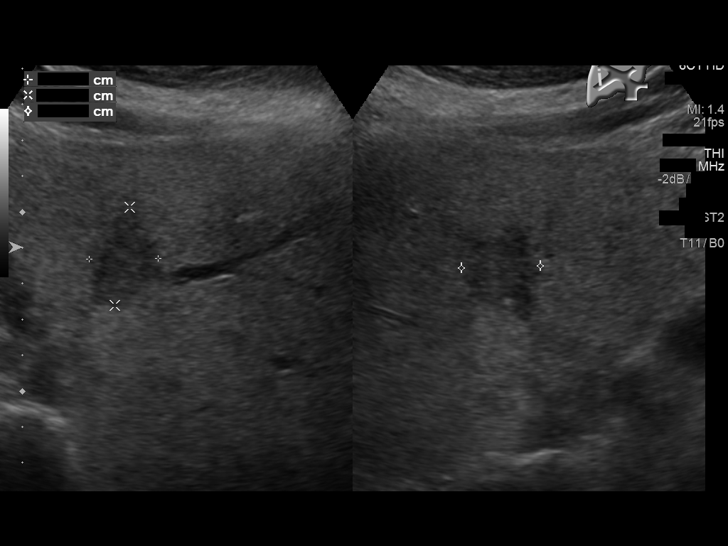
[im 49/59]
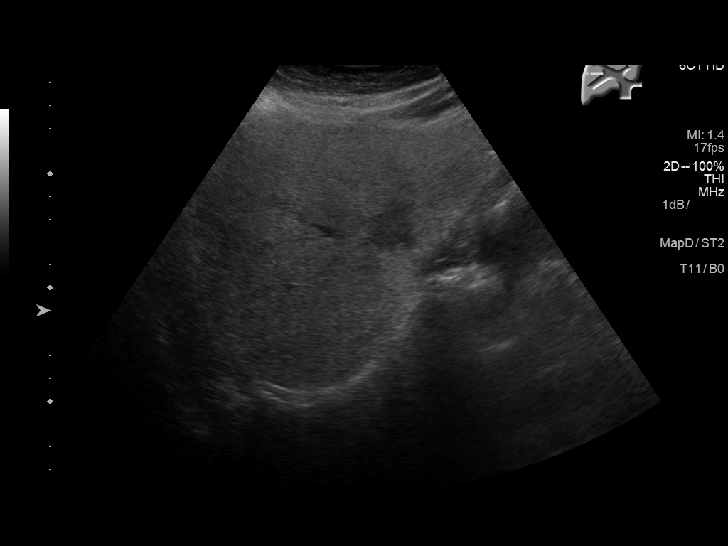
[im 54/59]
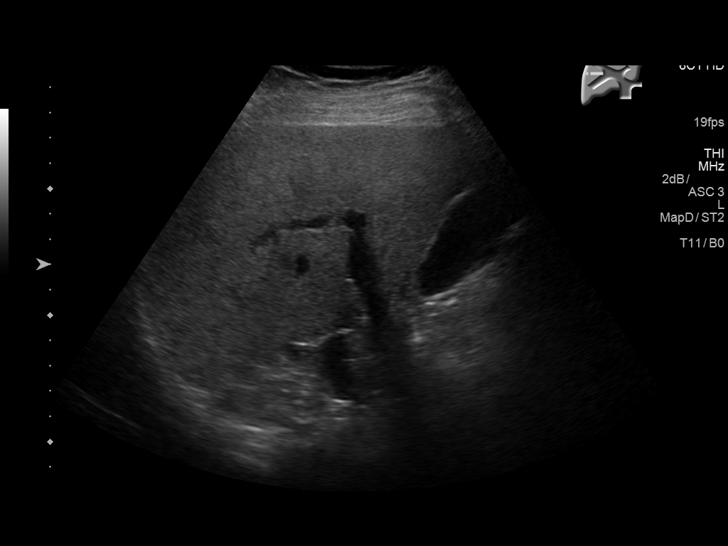
[im 59/59]
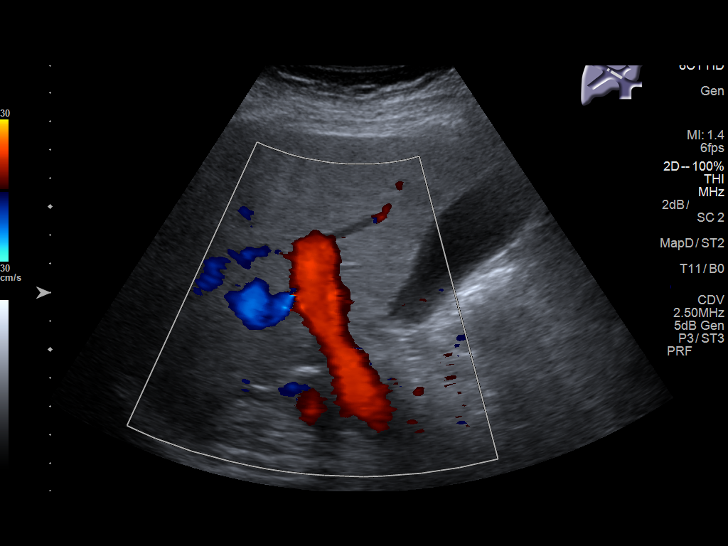

[13 of 25 positions shown; findings below may reference images not displayed]

FINDINGS: Gallbladder:

No gallstones or wall thickening visualized. No sonographic Murphy
sign noted by sonographer.

Common bile duct:

Diameter: 4 mm, normal.

Liver:

There are three focal lesions within the liver. The first is
predominantly hyperechoic and measures 3.0 x 3.7 x 2.9 cm and
demonstrates no internal vascularity. The other two lesions are
hypoechoic, somewhat irregular in appearance, and also demonstrate
no internal vascularity. These measure 1.9 x 2.8 x 2.2 cm and 2.4 x
2.8 x 2.1 cm.

Increased hepatic parenchymal echogenicity. Portal vein is patent on
color Doppler imaging with normal direction of blood flow towards
the liver.
IMPRESSION: 1. Three focal lesions within the liver. The first is predominantly
hyperechoic and measures 3.7 cm, possibly representing a hemangioma.
Per the patient, she has a history of liver hemangiomas diagnosed in
[QQ]. No comparison imaging is available.
2. Two additional liver lesions are predominantly hypoechoic and
somewhat irregular in appearance, measuring up to 2.8 cm. These
lesions are nonspecific. Further evaluation with contrast-enhanced
MRI is recommended in the absence of prior comparison imaging.
3. Hepatic steatosis.

## 2016-06-03 ENCOUNTER — Encounter: Payer: Self-pay | Admitting: *Deleted

## 2016-06-03 ENCOUNTER — Ambulatory Visit
Admission: EM | Admit: 2016-06-03 | Discharge: 2016-06-03 | Disposition: A | Discharge status: Home / Self Care | Payer: BC Managed Care – PPO | Attending: Internal Medicine | Admitting: Internal Medicine

## 2016-06-03 DIAGNOSIS — H6121 Impacted cerumen, right ear: Secondary | ICD-10-CM | POA: Diagnosis not present

## 2016-06-03 HISTORY — DX: Essential (primary) hypertension: I10

## 2016-06-03 HISTORY — DX: Diverticulitis of intestine, part unspecified, without perforation or abscess without bleeding: K57.92

## 2016-06-03 HISTORY — DX: Hyperlipidemia, unspecified: E78.5

## 2016-06-03 NOTE — ED Triage Notes (Signed)
PAtient started having symptoms of right ear fullness yesterday after washing her hair. Upon exam a wax plug is present in the right ear.

## 2016-06-03 NOTE — Discharge Instructions (Signed)
° °  Follow up with your primary care physician this week as needed. Return to Urgent care for new or worsening concerns.  ° °

## 2016-06-03 NOTE — ED Provider Notes (Signed)
[facility] ____________________________________________  Time seen: Approximately 10:21 AM  I have reviewed the triage vital signs and the nursing notes.   HISTORY  Chief Complaint Otalgia   HPI Wanda Thomas is a 61 y.o. female present for evaluation of clogged right ear sensation. Patient reports last night when she was in the shower she felt like she got water in her right ear, then used a Q-tip to remove and felt that she moved wax in her ear causing the ear to be clogged. Patient reports she did remove a large amount of wax with Q-tip from right ear last night. Denies any pain to right ear. Denies trauma, drainage or bleeding. Denies left ear complaints. Reports muffled decreased hearing to right ear at this time. Denies tinnitus. Denies recent cough, congestion, sore throat, fevers or other complaints. Patient reports otherwise feels well.Denies recent sickness. Denies recent antibiotic use.   MCLAUGHLIN, Wilhemena Durie, MD: PCP   Past Medical History:  Diagnosis Date  . Diverticulitis   . Hyperlipidemia   . Hypertension     There are no active problems to display for this patient.   Past Surgical History:  Procedure Laterality Date  . ANKLE SURGERY    . c section       No current facility-administered medications for this encounter.   Current Outpatient Prescriptions:  .  atenolol (TENORMIN) 50 MG tablet, Take 50 mg by mouth daily., Disp: , Rfl:  .  hydrochlorothiazide (HYDRODIURIL) 25 MG tablet, Take 25 mg by mouth daily., Disp: , Rfl:  .  lovastatin (MEVACOR) 40 MG tablet, Take 40 mg by mouth at bedtime., Disp: , Rfl:  .  phentermine 15 MG capsule, Take 15 mg by mouth every morning., Disp: , Rfl:   Allergies Patient has no known allergies.  Family History  Problem Relation Age of Onset  . Breast cancer Neg Hx     Social History Social History  Substance Use Topics  . Smoking status: Never Smoker  . Smokeless tobacco: Never Used  .  Alcohol use Yes    Review of Systems Constitutional: No fever/chills Eyes: No visual changes. ENT: No sore throat. Cardiovascular: Denies chest pain. Respiratory: Denies shortness of breath. Gastrointestinal: No abdominal pain.   Genitourinary: Negative for dysuria. Musculoskeletal: Negative for back pain. Skin: Negative for rash.  ____________________________________________   PHYSICAL EXAM:  VITAL SIGNS: ED Triage Vitals  Enc Vitals Group     BP 06/03/16 1005 (!) 135/96     Pulse Rate 06/03/16 1005 77     Resp 06/03/16 1005 16     Temp 06/03/16 1005 98.2 F (36.8 C)     Temp Source 06/03/16 1005 Oral     SpO2 06/03/16 1005 100 %     Weight 06/03/16 1007 159 lb (72.1 kg)     Height 06/03/16 1007 5\' 1"  (1.549 m)     Head Circumference --      Peak Flow --      Pain Score 06/03/16 1008 0     Pain Loc --      Pain Edu? --      Excl. in [place]? --    Constitutional: Alert and oriented. Well appearing and in no acute distress. Eyes: Conjunctivae are normal. PERRL. EOMI. Head: Atraumatic. No swelling. No erythema.  Ears: Right: Nontender, total cerumen impaction, post cerumen removal, ear canal clear, minimal erythema and TM otherwise appears normal. Left: Nontender, no erythema, normal TMs.  Nose: No nasal congestion or rhinorrhea.  Mouth/Throat: Mucous membranes are moist. No pharyngeal erythema. No tonsillar swelling or exudate.  Neck: No stridor.  No cervical spine tenderness to palpation. Cardiovascular: Normal rate, regular rhythm. Grossly normal heart sounds.  Good peripheral circulation. Respiratory: Normal respiratory effort.  No retractions. No wheezes, rales or rhonchi. Good air movement.  Musculoskeletal: Steady gait. Neurologic:  Normal speech and language. No gait instability. Skin:  Skin appears warm, dry  Psychiatric: Mood and affect are normal. Speech and behavior are normal. ___________________________________________   LABS (all labs ordered are listed,  but only abnormal results are displayed)  Labs Reviewed - No data to display  PROCEDURES Procedures  Procedure explained and verbal consent obtained. Ceruminosis is noted.  Wax is removed by curette and irrigation by myself and RN.Marland Kitchen Instructions for home care to prevent wax buildup are given.  INITIAL IMPRESSION / ASSESSMENT AND PLAN / ED COURSE  Pertinent labs & imaging results that were available during my care of the patient were reviewed by me and considered in my medical decision making (see chart for details).  Well appearing patient. No acute distress. Right total cerumen impaction. Post removal with curette and irrigation, patient reports feeling much improved patient reports hearing returned to normal and clogged sensation has resolved.   Discussed follow up with Primary care physician this week. Discussed follow up and return parameters including no resolution or any worsening concerns. Patient verbalized understanding and agreed to plan.   ____________________________________________   FINAL CLINICAL IMPRESSION(S) / ED DIAGNOSES  Final diagnoses:  Hearing loss of right ear due to cerumen impaction     Discharge Medication List as of 06/03/2016 10:52 AM      Note: This dictation was prepared with Dragon dictation along with smaller phrase technology. Any transcriptional errors that result from this process are unintentional.         Marylene Land, NP 06/03/16 1239

## 2016-10-23 ENCOUNTER — Encounter: Payer: Self-pay | Admitting: *Deleted

## 2016-10-23 ENCOUNTER — Ambulatory Visit
Admission: EM | Admit: 2016-10-23 | Discharge: 2016-10-23 | Disposition: A | Discharge status: Home / Self Care | Payer: BC Managed Care – PPO | Attending: Family Medicine | Admitting: Family Medicine

## 2016-10-23 DIAGNOSIS — L237 Allergic contact dermatitis due to plants, except food: Secondary | ICD-10-CM

## 2016-10-23 DIAGNOSIS — R21 Rash and other nonspecific skin eruption: Secondary | ICD-10-CM

## 2016-10-23 MED ORDER — CLOBETASOL PROPIONATE 0.05 % EX CREA: 1.0000 "application " | TOPICAL_CREAM | Freq: Two times a day (BID) | CUTANEOUS | 0 refills | Status: AC

## 2016-10-23 MED ORDER — METHYLPREDNISOLONE 4 MG PO TBPK: ORAL_TABLET | ORAL | 0 refills | Status: DC

## 2016-10-23 NOTE — ED Triage Notes (Signed)
Possible poison ivy rash to hands, face, neck, and arms x2 days.

## 2016-10-23 NOTE — ED Provider Notes (Signed)
[facility]    CSN: [id number] Arrival date & time: 10/23/16  1446     History   Chief Complaint Chief Complaint  Patient presents with  . Rash    HPI Wanda Thomas is a 61 y.o. female.   HPI  This is a 61 year old female who presents with  A rash to her hands face and neck as well as her arms that she's had for 2 days. She states that that she was in her garden over the weekend and noticed one small plant that she pulled thinking that it may of been poison ivy. Days later she started having the symptoms. The worst is interdigitally between her fourth and third fingers in the webspace . She used Epsom salts on the area and it has dried is somewhat. There are areas on her right index finger that has just appeared as small vesicles on erythematous base. She has more areas on her chin and the left side of her neck where she likely had rubbed her hand on to wipe off sweat. It is not extensive.         Past Medical History:  Diagnosis Date  . Diverticulitis   . Hyperlipidemia   . Hypertension     There are no active problems to display for this patient.   Past Surgical History:  Procedure Laterality Date  . ANKLE SURGERY    . c section      OB History    No data available       Home Medications    Prior to Admission medications   Medication Sig Start Date End Date Taking? Authorizing Provider  atenolol (TENORMIN) 50 MG tablet Take 50 mg by mouth daily.   Yes [provider]  hydrochlorothiazide (HYDRODIURIL) 25 MG tablet Take 25 mg by mouth daily.   Yes [provider]  lovastatin (MEVACOR) 40 MG tablet Take 40 mg by mouth at bedtime.   Yes [provider]  clobetasol cream (TEMOVATE) 9.56 % Apply 1 application topically 2 (two) times daily. 10/23/16   Lorin Picket, PA-C  methylPREDNISolone (MEDROL DOSEPAK) 4 MG TBPK tablet Take per package instructions 10/23/16   Lorin Picket, PA-C  phentermine 15 MG capsule  Take 15 mg by mouth every morning.    [provider]    Family History Family History  Problem Relation Age of Onset  . Breast cancer Neg Hx     Social History Social History  Substance Use Topics  . Smoking status: Never Smoker  . Smokeless tobacco: Never Used  . Alcohol use Yes     Allergies   Patient has no known allergies.   Review of Systems Review of Systems  Constitutional: Positive for activity change. Negative for appetite change, chills, fatigue and fever.  Skin: Positive for rash.  All other systems reviewed and are negative.    Physical Exam Triage Vital Signs ED Triage Vitals  Enc Vitals Group     BP 10/23/16 1503 140/70     Pulse Rate 10/23/16 1503 85     Resp 10/23/16 1503 16     Temp 10/23/16 1503 98.7 F (37.1 C)     Temp Source 10/23/16 1503 Oral     SpO2 10/23/16 1503 100 %     Weight 10/23/16 1504 160 lb (72.6 kg)     Height 10/23/16 1504 5\' 1"  (1.549 m)     Head Circumference --      Peak Flow --  Pain Score --      Pain Loc --      Pain Edu? --      Excl. in [place]? --    No data found.   Updated Vital Signs BP 140/70 (BP Location: Left Arm)   Pulse 85   Temp 98.7 F (37.1 C) (Oral)   Resp 16   Ht 5\' 1"  (1.549 m)   Wt 160 lb (72.6 kg)   SpO2 100%   BMI 30.23 kg/m   Visual Acuity Right Eye Distance:   Left Eye Distance:   Bilateral Distance:    Right Eye Near:   Left Eye Near:    Bilateral Near:     Physical Exam  Constitutional: She is oriented to person, place, and time. She appears well-developed and well-nourished. No distress.  HENT:  Head: Normocephalic.  Eyes: Pupils are equal, round, and reactive to light.  Neck: Normal range of motion.  Musculoskeletal: Normal range of motion.  Neurological: She is alert and oriented to person, place, and time.  Skin: Skin is warm and dry. She is not diaphoretic.  Refer to photograph for detailed  Psychiatric: She has a normal mood and affect. Her behavior is  normal. Judgment and thought content normal.  Nursing note and vitals reviewed.        UC Treatments / Results  Labs (all labs ordered are listed, but only abnormal results are displayed) Labs Reviewed - No data to display  EKG  EKG Interpretation None       Radiology No results found.  Procedures Procedures (including critical care time)  Medications Ordered in UC Medications - No data to display   Initial Impression / Assessment and Plan / UC Course  I have reviewed the triage vital signs and the nursing notes.  Pertinent labs & imaging results that were available during my care of the patient were reviewed by me and considered in my medical decision making (see chart for details).     Plan: 1. Test/x-ray results and diagnosis reviewed with patient 2. rx as per orders; risks, benefits, potential side effects reviewed with patient 3. Recommend supportive treatment with use of the steroid cream to the areas twice a day. Because of the high potency, use it no more than 7-10 days. Because the rash at this time is not extensive ,treat her with a short course of prednisone initially and rely more on the topical cream. If it worsen she should follow-up with her primary care physician may return to our clinic or go to a dermatologist. 4. F/u prn if symptoms worsen or don't improve   Final Clinical Impressions(s) / UC Diagnoses   Final diagnoses:  Allergic contact dermatitis due to plants, except food    New Prescriptions Discharge Medication List as of 10/23/2016  3:48 PM    START taking these medications   Details  clobetasol cream (TEMOVATE) 0.86 % Apply 1 application topically 2 (two) times daily., Starting Fri 10/23/2016, Normal    methylPREDNISolone (MEDROL DOSEPAK) 4 MG TBPK tablet Take per package instructions, Normal         Controlled Substance Prescriptions [place] Controlled Substance Registry consulted? Not Applicable   Lorin Picket,  PA-C 10/23/16 1624

## 2017-03-22 ENCOUNTER — Other Ambulatory Visit: Payer: Self-pay | Admitting: Obstetrics and Gynecology

## 2017-03-22 DIAGNOSIS — Z1231 Encounter for screening mammogram for malignant neoplasm of breast: Secondary | ICD-10-CM

## 2017-03-25 ENCOUNTER — Ambulatory Visit
Admission: RE | Admit: 2017-03-25 | Discharge: 2017-03-25 | Disposition: A | Discharge status: Home / Self Care | Payer: BC Managed Care – PPO | Source: Ambulatory Visit | Attending: Obstetrics and Gynecology | Admitting: Obstetrics and Gynecology

## 2017-03-25 DIAGNOSIS — Z1231 Encounter for screening mammogram for malignant neoplasm of breast: Secondary | ICD-10-CM | POA: Insufficient documentation

## 2017-03-25 IMAGING — MG MM DIGITAL SCREENING BILAT W/ TOMO W/ CAD
8 of 12 series · 8 of 28 positions shown · non-contrast
Comparison: Previous exam(s).

CLINICAL DATA: Screening.

EXAM:
2D DIGITAL SCREENING BILATERAL MAMMOGRAM WITH 3D TOMO WITH CAD

[L CC]
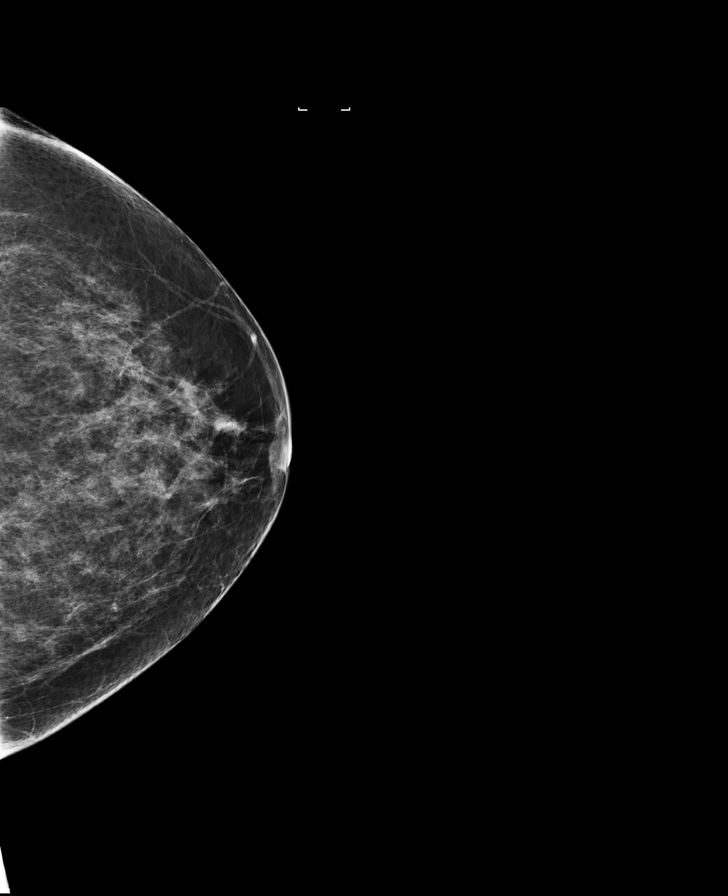

[R MLO]
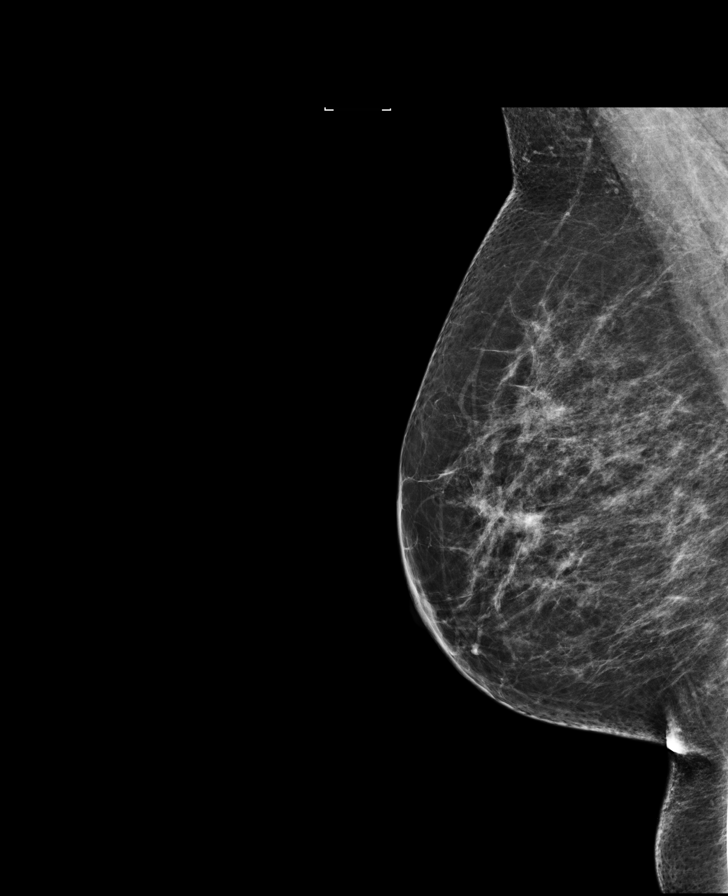

[L CC synth-2D]
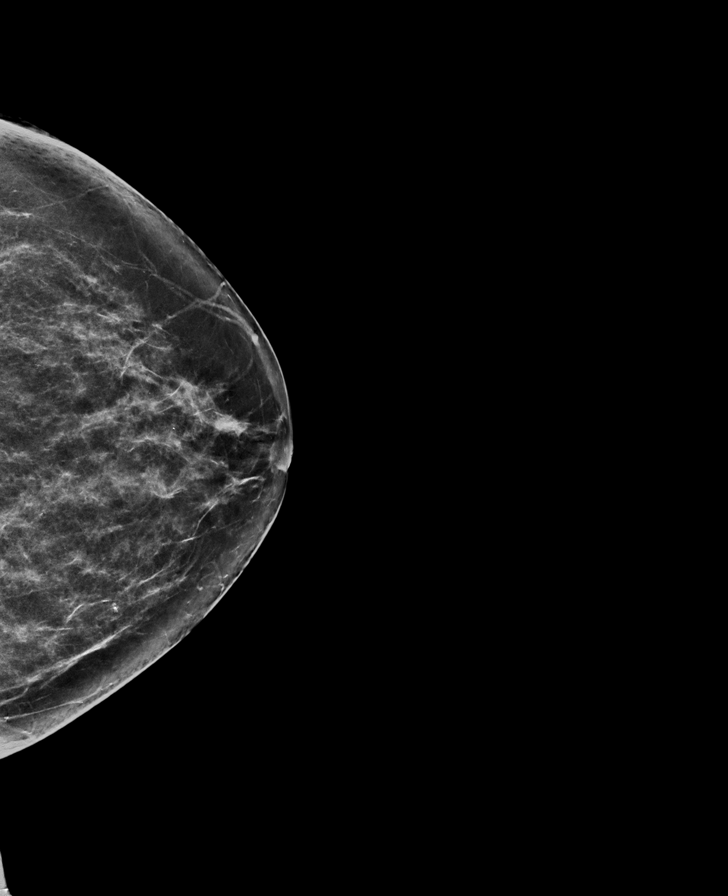

[R CC]
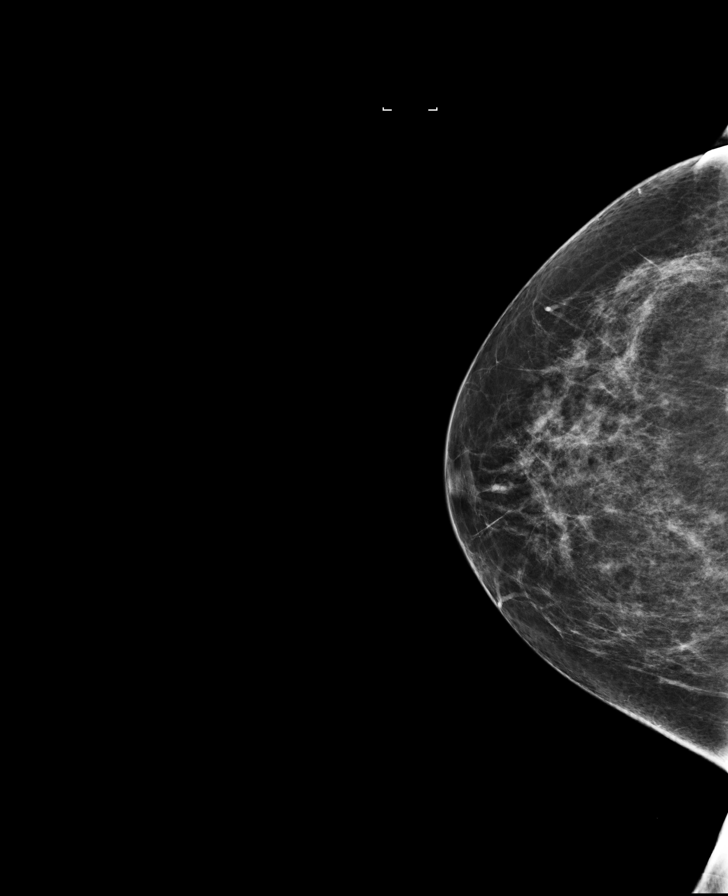

[L MLO]
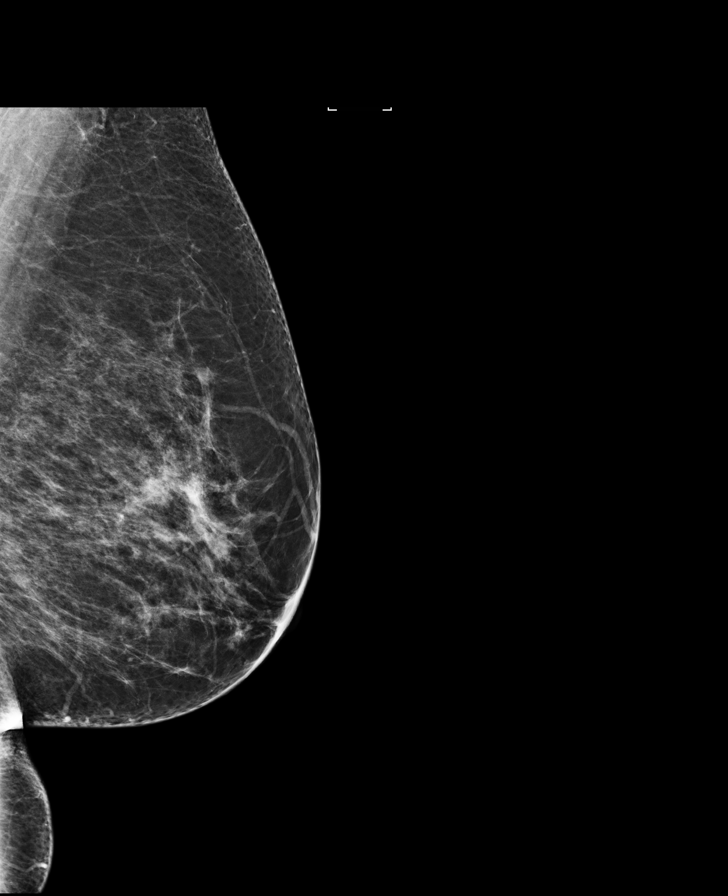

[L MLO synth-2D]
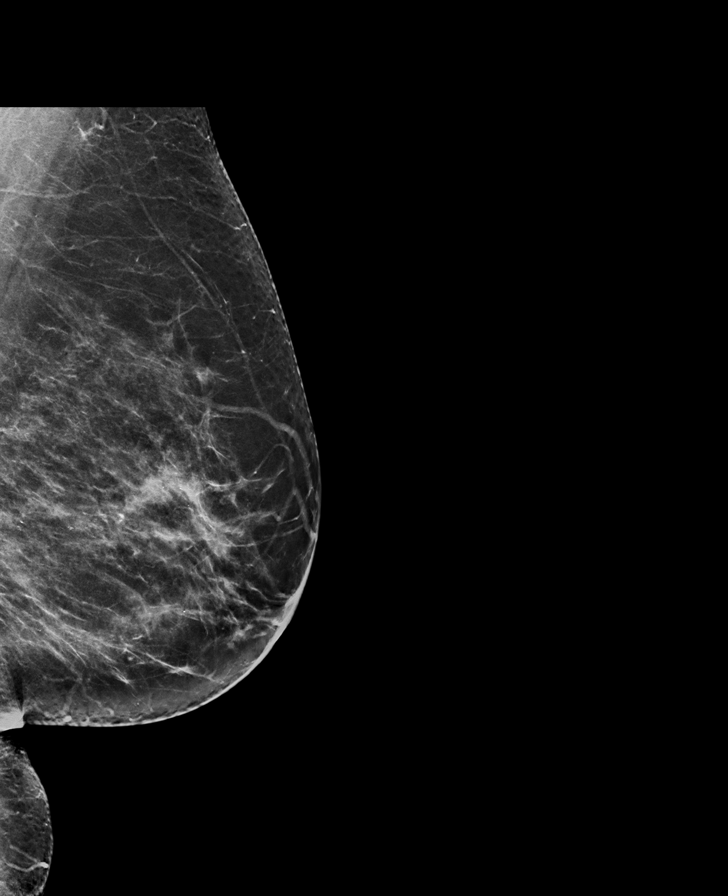

[R MLO synth-2D]
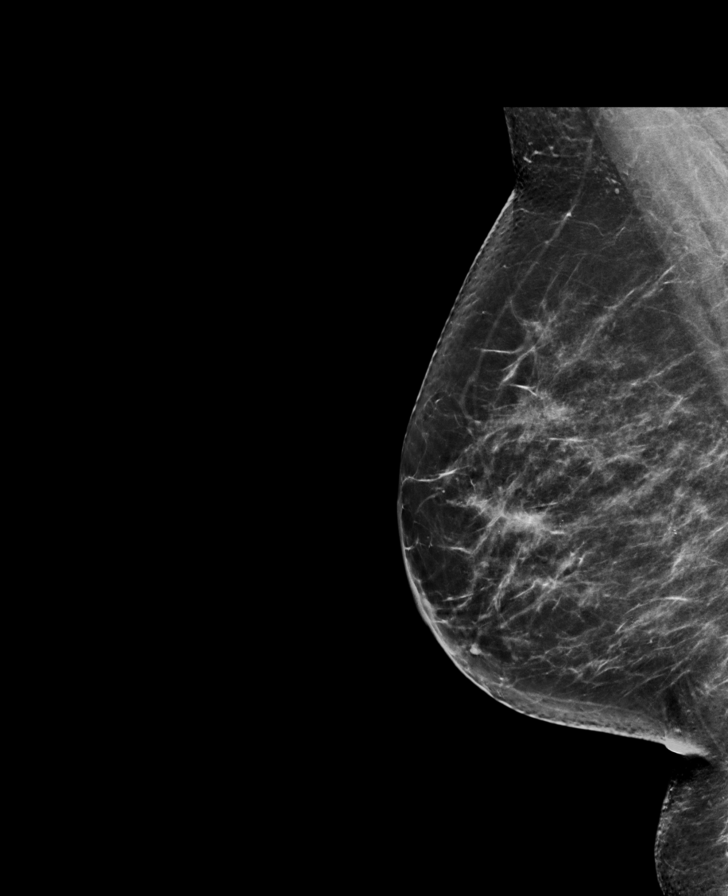

[R CC synth-2D]
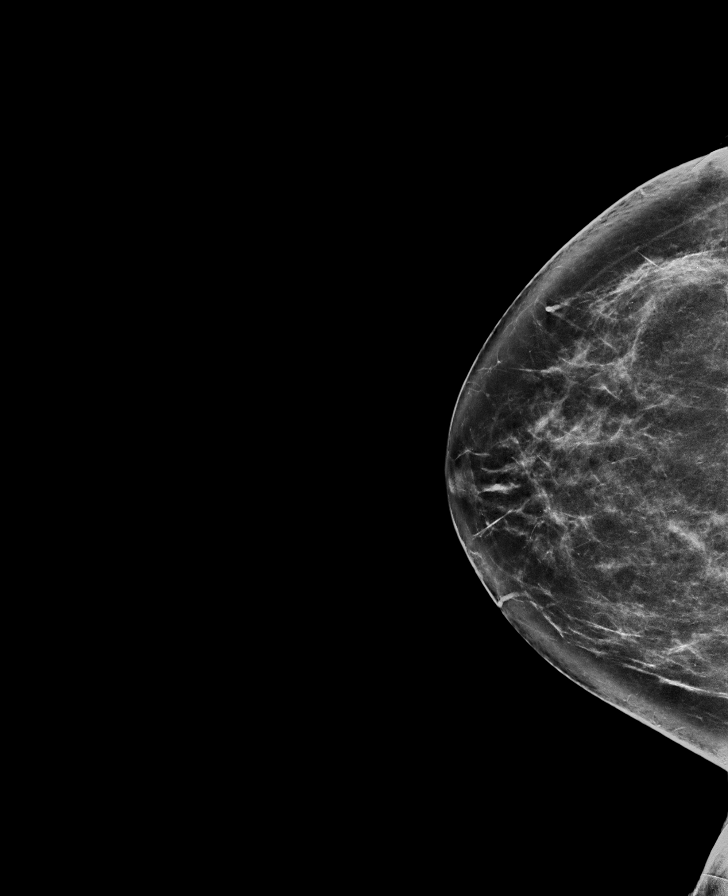

[8 of 28 positions shown; findings below may reference images not displayed]

ACR Breast Density Category b: There are scattered areas of
fibroglandular density.
FINDINGS: There are no findings suspicious for malignancy. Images were
processed with CAD.
IMPRESSION: No mammographic evidence of malignancy. A result letter of this
screening mammogram will be mailed directly to the patient.

RECOMMENDATION:
Screening mammogram in one year. (Code:[4P])

BI-RADS CATEGORY  1: Negative.

## 2017-05-14 ENCOUNTER — Other Ambulatory Visit: Payer: Self-pay | Admitting: Physician Assistant

## 2017-05-14 ENCOUNTER — Other Ambulatory Visit (HOSPITAL_COMMUNITY): Payer: Self-pay | Admitting: Physician Assistant

## 2017-05-14 DIAGNOSIS — R945 Abnormal results of liver function studies: Principal | ICD-10-CM

## 2017-05-14 DIAGNOSIS — R7989 Other specified abnormal findings of blood chemistry: Secondary | ICD-10-CM

## 2017-05-24 ENCOUNTER — Ambulatory Visit
Admission: RE | Admit: 2017-05-24 | Discharge: 2017-05-24 | Disposition: A | Discharge status: Home / Self Care | Payer: BC Managed Care – PPO | Source: Ambulatory Visit | Attending: Physician Assistant | Admitting: Physician Assistant

## 2017-05-24 ENCOUNTER — Encounter (INDEPENDENT_AMBULATORY_CARE_PROVIDER_SITE_OTHER): Payer: Self-pay

## 2017-05-24 DIAGNOSIS — Z0000 Encounter for general adult medical examination without abnormal findings: Secondary | ICD-10-CM | POA: Diagnosis not present

## 2017-05-24 DIAGNOSIS — K760 Fatty (change of) liver, not elsewhere classified: Secondary | ICD-10-CM | POA: Insufficient documentation

## 2017-05-24 DIAGNOSIS — K7689 Other specified diseases of liver: Secondary | ICD-10-CM | POA: Insufficient documentation

## 2017-05-24 DIAGNOSIS — R945 Abnormal results of liver function studies: Secondary | ICD-10-CM | POA: Insufficient documentation

## 2017-05-24 DIAGNOSIS — R7989 Other specified abnormal findings of blood chemistry: Secondary | ICD-10-CM

## 2018-05-25 ENCOUNTER — Other Ambulatory Visit: Payer: Self-pay | Admitting: Physician Assistant

## 2018-05-25 DIAGNOSIS — Z1231 Encounter for screening mammogram for malignant neoplasm of breast: Secondary | ICD-10-CM

## 2019-03-21 ENCOUNTER — Other Ambulatory Visit (HOSPITAL_COMMUNITY): Payer: Self-pay | Admitting: Physician Assistant

## 2019-03-21 ENCOUNTER — Other Ambulatory Visit: Payer: Self-pay | Admitting: Physician Assistant

## 2019-03-21 DIAGNOSIS — K30 Functional dyspepsia: Secondary | ICD-10-CM

## 2019-03-21 DIAGNOSIS — R0602 Shortness of breath: Secondary | ICD-10-CM

## 2019-03-21 DIAGNOSIS — R0789 Other chest pain: Secondary | ICD-10-CM

## 2019-03-21 DIAGNOSIS — R7401 Elevation of levels of liver transaminase levels: Secondary | ICD-10-CM

## 2019-03-29 ENCOUNTER — Ambulatory Visit
Admission: RE | Admit: 2019-03-29 | Discharge: 2019-03-29 | Disposition: A | Discharge status: Home / Self Care | Payer: BC Managed Care – PPO | Source: Ambulatory Visit | Attending: Physician Assistant | Admitting: Physician Assistant

## 2019-03-29 ENCOUNTER — Other Ambulatory Visit: Payer: Self-pay

## 2019-03-29 DIAGNOSIS — R0602 Shortness of breath: Secondary | ICD-10-CM | POA: Diagnosis present

## 2019-03-29 DIAGNOSIS — R7401 Elevation of levels of liver transaminase levels: Secondary | ICD-10-CM | POA: Insufficient documentation

## 2019-03-29 DIAGNOSIS — R0789 Other chest pain: Secondary | ICD-10-CM

## 2019-03-29 DIAGNOSIS — K30 Functional dyspepsia: Secondary | ICD-10-CM | POA: Insufficient documentation

## 2019-03-29 IMAGING — US US ABDOMEN LIMITED
1 series · 13 of 25 positions shown · non-contrast
Comparison: Abdominal ultrasound [DATE].

CLINICAL DATA: 64-year-old female with history of elevated ALT.

EXAM:
ULTRASOUND ABDOMEN LIMITED RIGHT UPPER QUADRANT

[Series 1: us abdomen limited · 0.19mm/px · 13 of 76 slices shown]
[im 1/76]
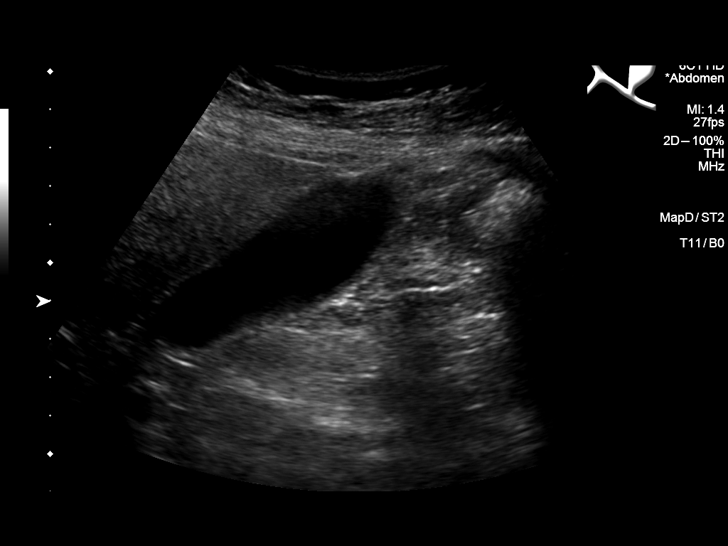
[im 7/76]
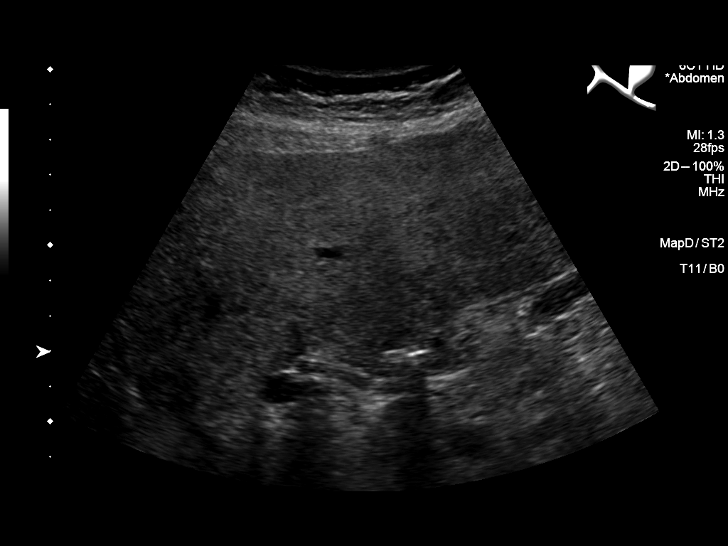
[im 13/76]
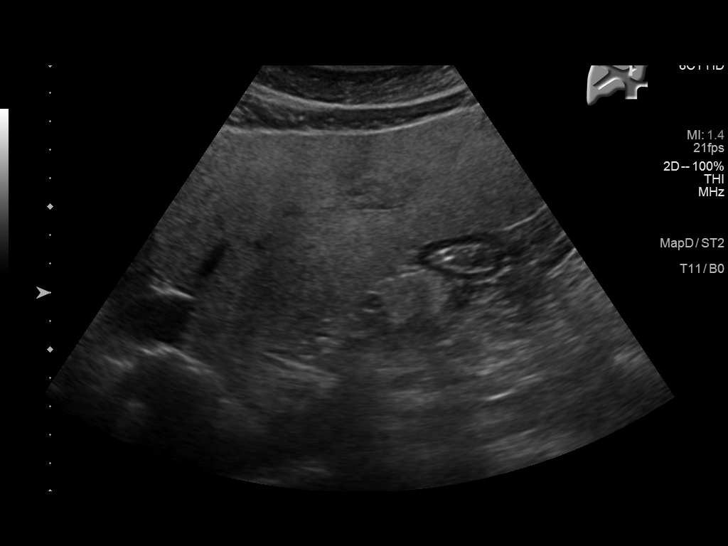
[im 19/76]
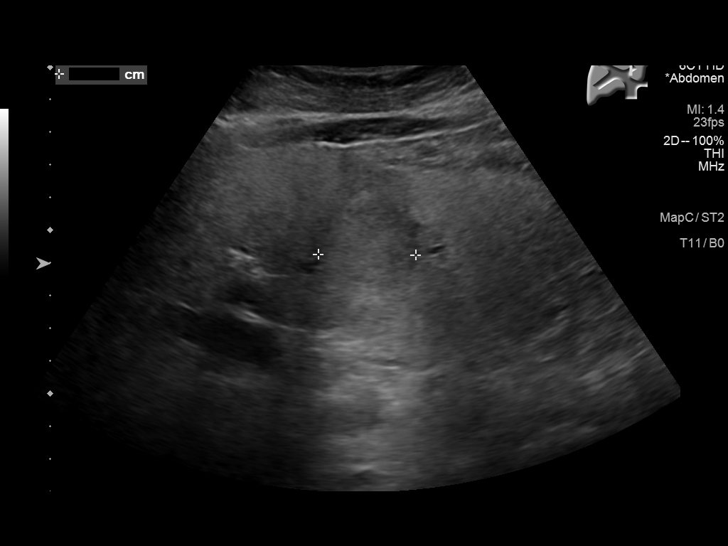
[im 26/76]
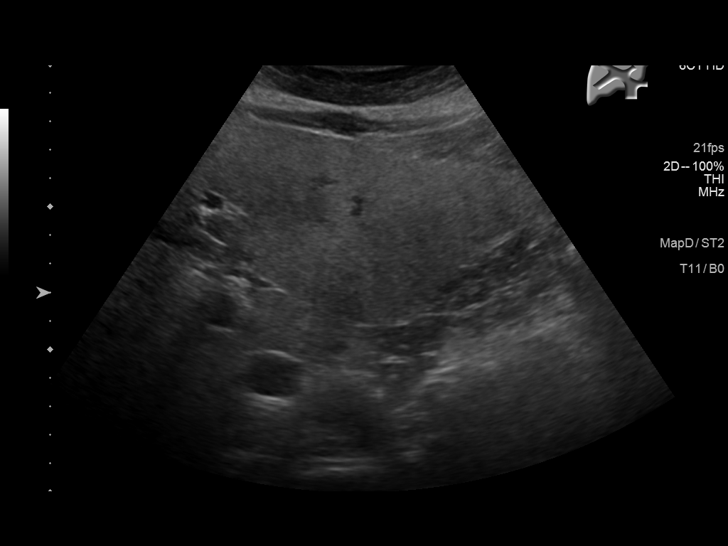
[im 32/76]
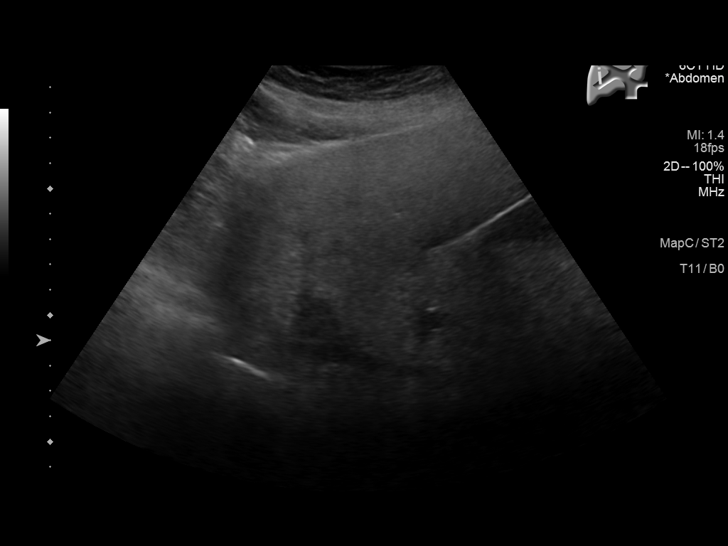
[im 38/76]
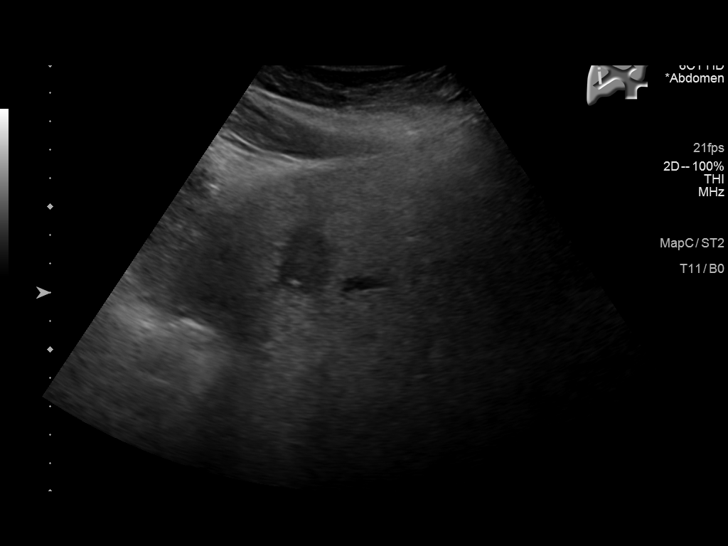
[im 44/76]
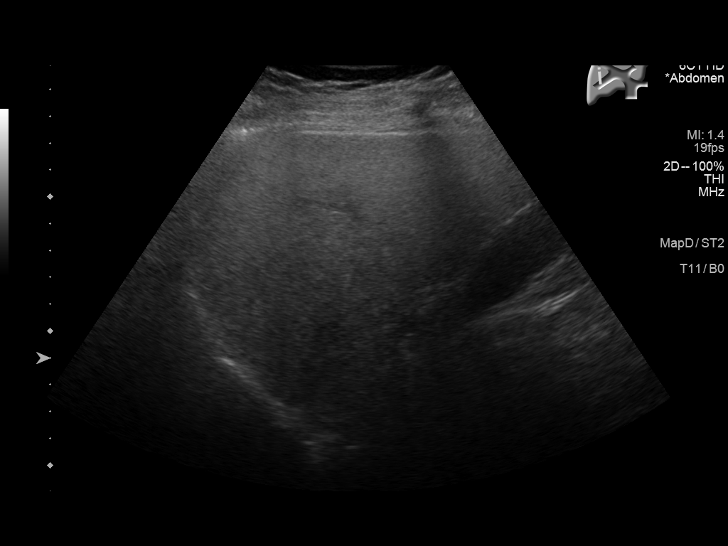
[im 51/76]
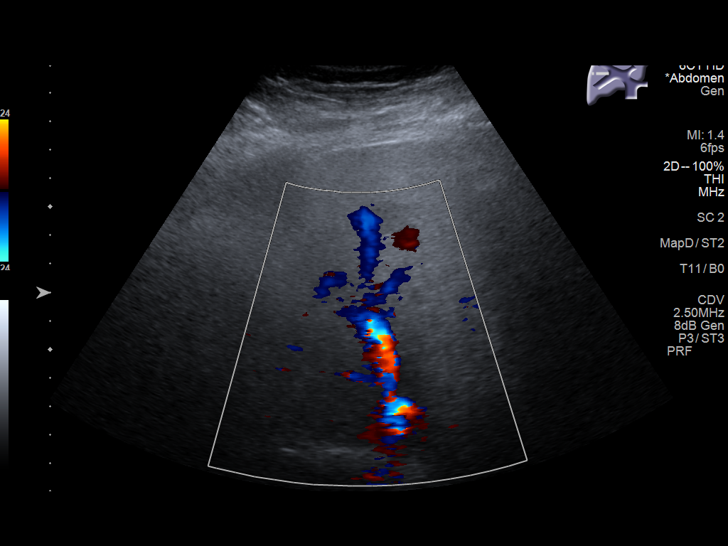
[im 57/76]
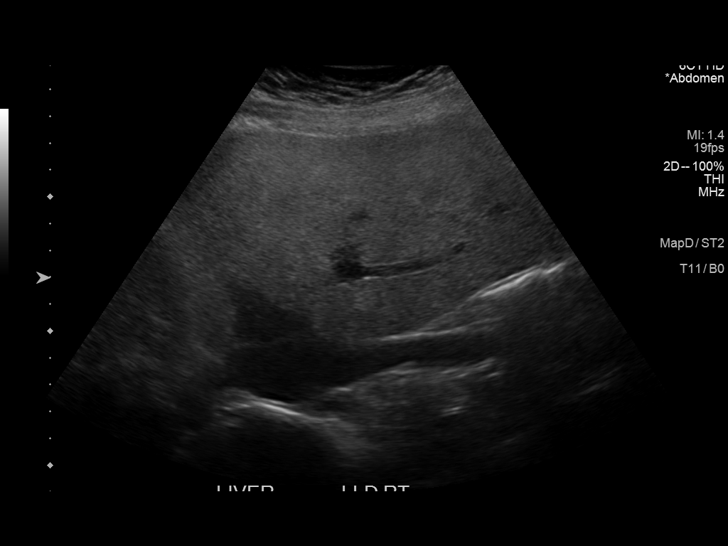
[im 63/76]
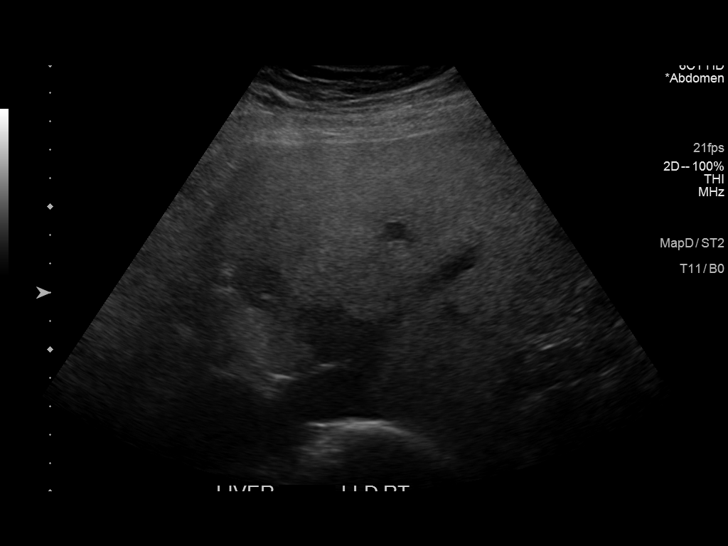
[im 69/76]
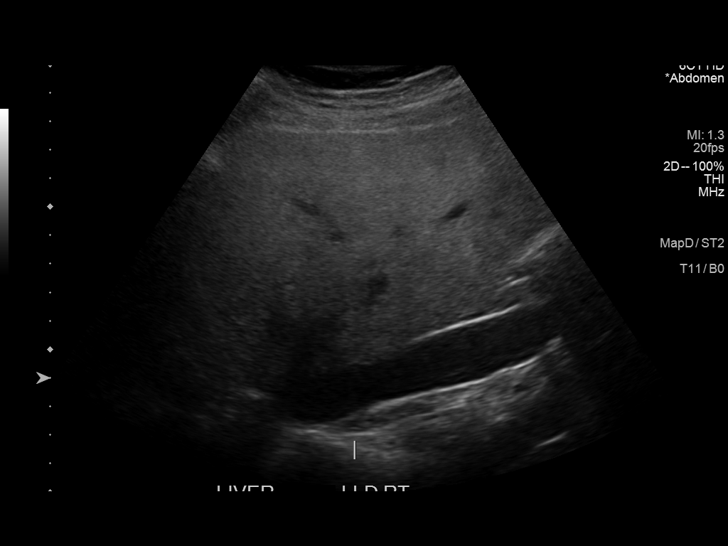
[im 76/76]
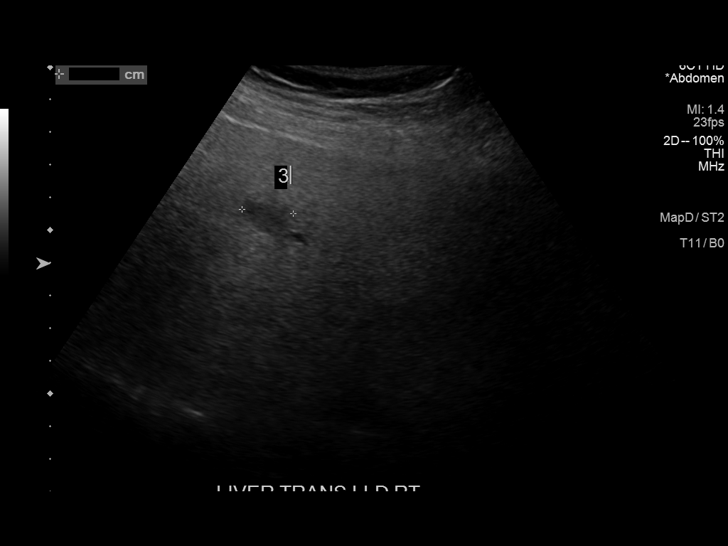

[13 of 25 positions shown; findings below may reference images not displayed]

FINDINGS: Gallbladder:

No gallstones or wall thickening visualized. No sonographic Murphy
sign noted by sonographer.

Common bile duct:

Diameter: 3.1 mm

Liver:

3-4 lesions are noted in the liver. Left lobe lesion measures 3.3 x
3.8 x 3.0 cm and is nearly isoechoic to hepatic parenchyma with
increased through transmission but no definite internal blood flow.
Right lobe lesion measures 2.4 x 1.8 x 2.4 cm and is hypoechoic
without definite increased through transmission. Additional lesion
in the right lobe of the liver is hypoechoic with increased through
transmission measuring 2.1 x 2.4 x 1.9 cm with a small focus of
increased echogenicity lying dependently within the lesion, without
posterior acoustic shadowing. Additional smaller hypoechoic lesion
with no increased through transmission or shadowing measuring 1.6 cm
in the right lobe of the liver. Diffusely increased hepatic
echogenicity, indicative of hepatic steatosis. Portal vein is patent
on color Doppler imaging with normal direction of blood flow towards
the liver.

Other: None.
IMPRESSION: 1. Multiple hepatic lesions, as above. These are incompletely
characterized on today's examination. Some morphologic changes are
noted compared to the prior examination. Further characterization
with nonemergent abdominal MRI with and without IV gadolinium is
recommended in the near future to definitively characterize these
lesions and exclude malignancy.
2. Hepatic steatosis.

## 2019-04-18 ENCOUNTER — Other Ambulatory Visit: Payer: Self-pay | Admitting: Gastroenterology

## 2019-04-18 DIAGNOSIS — K7689 Other specified diseases of liver: Secondary | ICD-10-CM

## 2019-04-21 ENCOUNTER — Ambulatory Visit: Payer: BC Managed Care – PPO

## 2019-04-22 ENCOUNTER — Ambulatory Visit: Payer: BC Managed Care – PPO | Attending: Internal Medicine

## 2019-04-22 DIAGNOSIS — Z23 Encounter for immunization: Secondary | ICD-10-CM | POA: Insufficient documentation

## 2019-04-22 NOTE — Progress Notes (Signed)
   Covid-19 Vaccination Clinic  Name:  Wanda Thomas    MRN: [id number] DOB: 1955-03-27  04/22/2019  Wanda Thomas was observed post Covid-19 immunization for 30 minutes based on pre-vaccination screening without incidence. She was provided with Vaccine Information Sheet and instruction to access the V-Safe system.   Wanda Thomas was instructed to call 911 with any severe reactions post vaccine: Marland Kitchen Difficulty breathing  . Swelling of your face and throat  . A fast heartbeat  . A bad rash all over your body  . Dizziness and weakness    Immunizations Administered    Name Date Dose VIS Date Route   Pfizer COVID-19 Vaccine 04/22/2019 12:09 PM 0.3 mL 02/17/2019 Intramuscular   Manufacturer: [place]   Lot: [id number]   [place]: [id number]

## 2019-04-28 ENCOUNTER — Ambulatory Visit
Admission: RE | Admit: 2019-04-28 | Discharge: 2019-04-28 | Disposition: A | Discharge status: Home / Self Care | Payer: BC Managed Care – PPO | Source: Ambulatory Visit | Attending: Gastroenterology | Admitting: Gastroenterology

## 2019-04-28 ENCOUNTER — Ambulatory Visit: Payer: BC Managed Care – PPO

## 2019-04-28 ENCOUNTER — Other Ambulatory Visit: Payer: Self-pay

## 2019-04-28 DIAGNOSIS — K7689 Other specified diseases of liver: Secondary | ICD-10-CM | POA: Insufficient documentation

## 2019-04-28 IMAGING — MR MR ABDOMEN WO/W CM
18 series · 47 of 48 positions shown · IV contrast (gadavist)
Comparison: Right upper quadrant ultrasound dated [DATE]

CLINICAL DATA: Liver lesions on ultrasound

EXAM:
MRI ABDOMEN WITHOUT AND WITH CONTRAST
TECHNIQUE: Multiplanar multisequence MR imaging of the abdomen was performed
both before and after the administration of intravenous contrast.
CONTRAST:  7.5mL GADAVIST GADOBUTROL 1 MMOL/ML IV SOLN

[Series 2: T2 · coronal · 6.0mm · 1.19mm/px · 2 of 30 slices shown (1 of 2)]
[im 1/30]
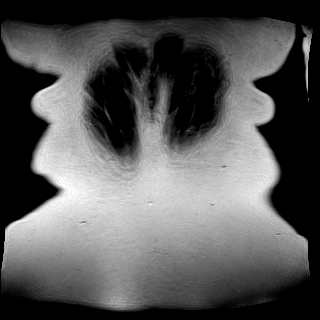
[im 30/30]
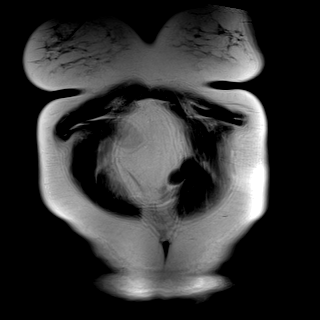

[Series 3: T2 · axial · 6.0mm · 1.19mm/px · z∈[-112,+112]mm · 2 of 32 slices shown (2 of 2)]
[im 1/32]
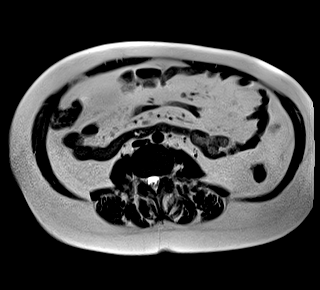
[im 32/32]
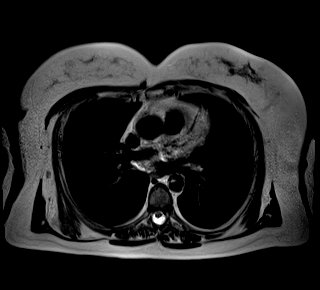

[Series 5: T2 fat-sat · axial · 6.0mm · 1.19mm/px · z∈[-125,+112]mm · 2 of 34 slices shown]
[im 1/34]
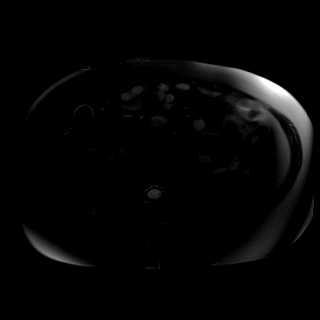
[im 34/34]
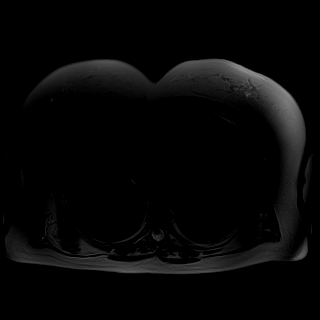

[Series 6: ax dwi_tracew · axial · 6.0mm · 1.42mm/px · z∈[-125,+112]mm · 5 of 102 slices shown]
[im 1/102]
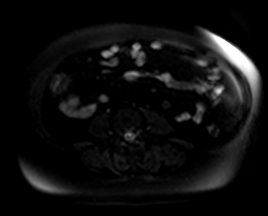
[im 26/102]
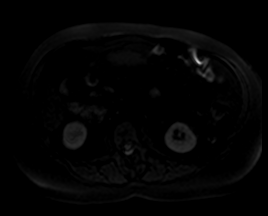
[im 51/102]
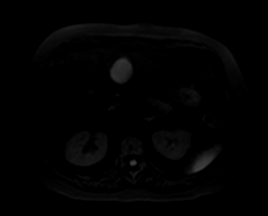
[im 76/102]
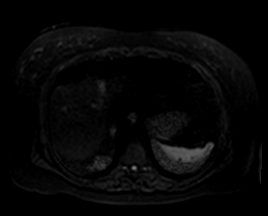
[im 102/102]
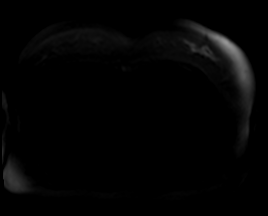

[Series 7: ax dwi_adc · axial · 6.0mm · 1.42mm/px · z∈[-125,+112]mm · 2 of 34 slices shown]
[im 1/34]
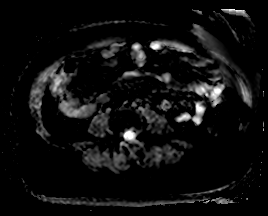
[im 34/34]
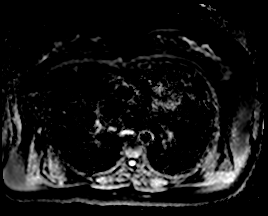

[Series 8: T1 · axial · 6.0mm · 0.74mm/px · z∈[-112,+112]mm · 2 of 32 slices shown (1 of 2)]
[im 1/32]
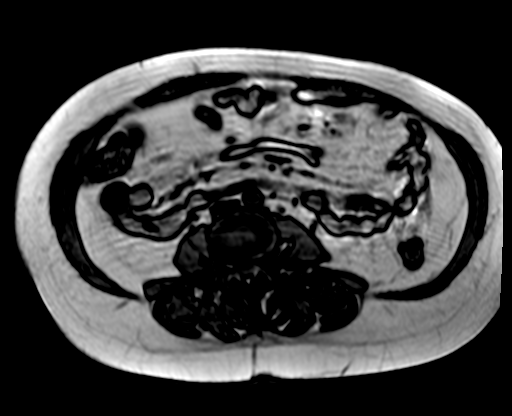
[im 32/32]
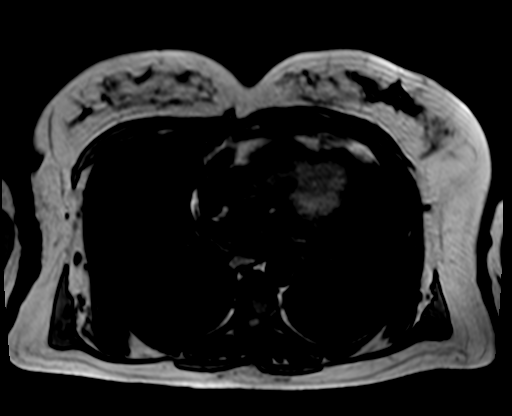

[Series 8: T1 · axial · 6.0mm · 0.74mm/px · z∈[-112,+112]mm · 2 of 32 slices shown (2 of 2)]
[im 1/32]
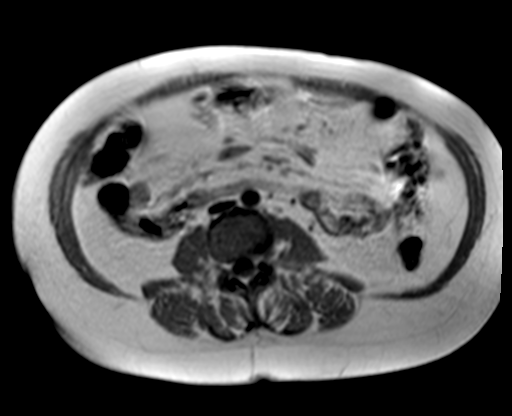
[im 32/32]
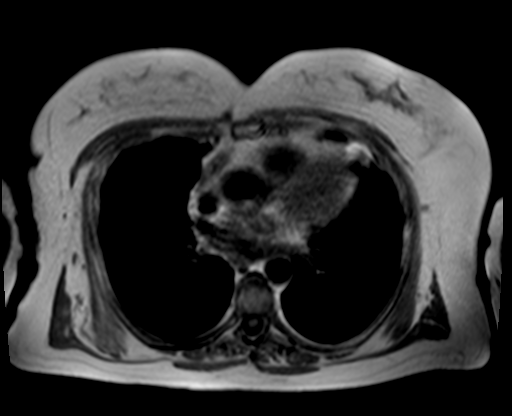

[Series 9: bSSFP · axial · 6.0mm · 0.74mm/px · 1 of 32 slices shown]
[im 1/32]
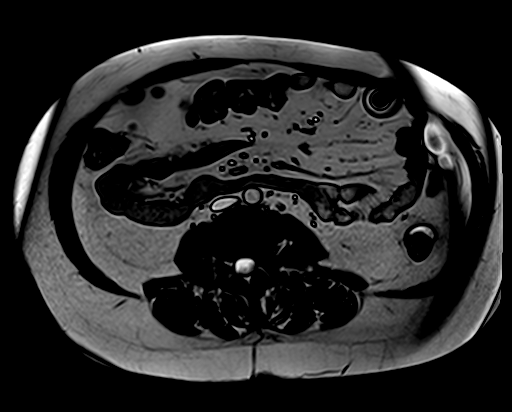

[Series 10: T1 dynamic fat-sat · axial · non-contrast · 3.0mm · 1.19mm/px · z∈[-118,+118]mm · 3 of 80 slices shown (1 of 5)]
[im 1/80]
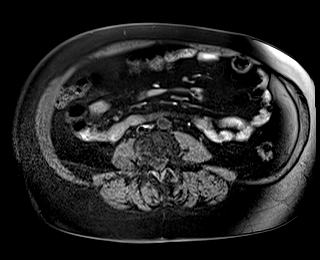
[im 40/80]
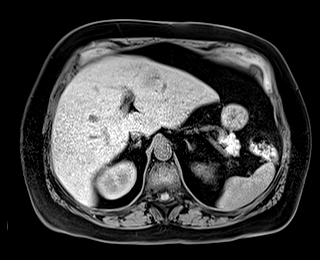
[im 80/80]
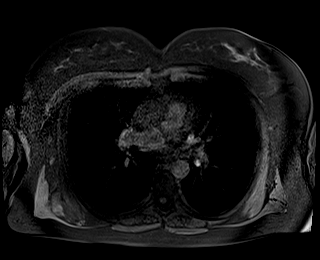

[Series 11: T1 dynamic fat-sat post-contrast · axial · 3.0mm · 1.19mm/px · z∈[-118,+118]mm · 3 of 80 slices shown (1 of 4)]
[im 1/80]
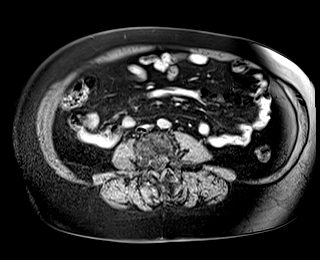
[im 40/80]
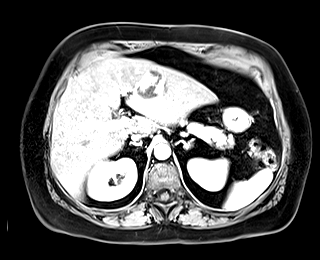
[im 80/80]
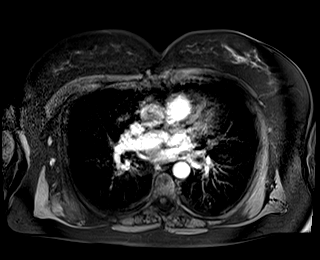

[Series 12: T1 dynamic fat-sat · axial · 3.0mm · 1.19mm/px · z∈[-118,+118]mm · 3 of 80 slices shown (2 of 5)]
[im 1/80]
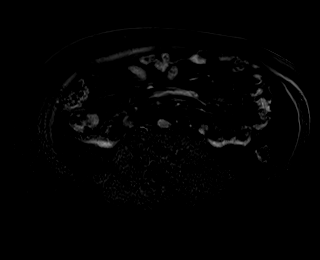
[im 40/80]
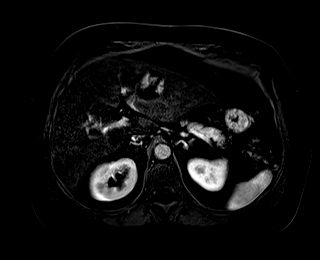
[im 80/80]
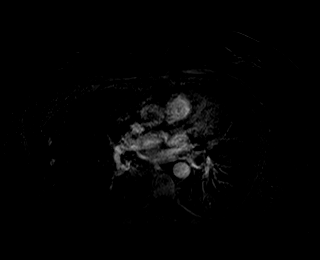

[Series 13: T1 dynamic fat-sat post-contrast · axial · 3.0mm · 1.19mm/px · z∈[-118,+118]mm · 3 of 80 slices shown (2 of 4)]
[im 1/80]
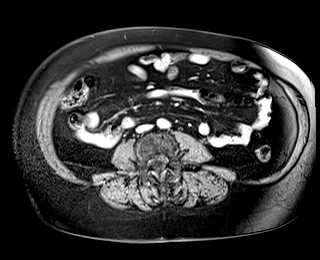
[im 40/80]
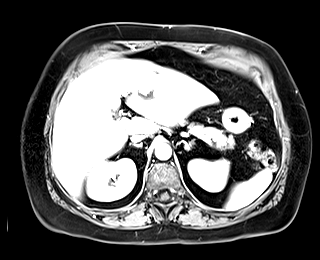
[im 80/80]
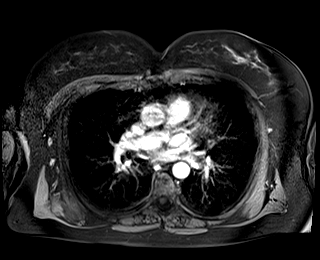

[Series 14: T1 dynamic fat-sat · axial · 3.0mm · 1.19mm/px · z∈[-118,+118]mm · 3 of 80 slices shown (3 of 5)]
[im 1/80]
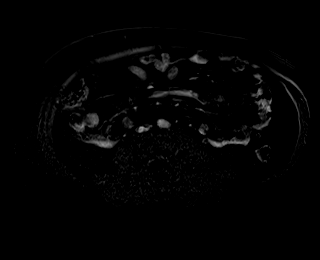
[im 40/80]
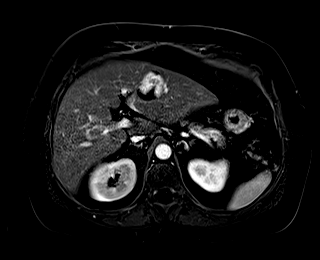
[im 80/80]
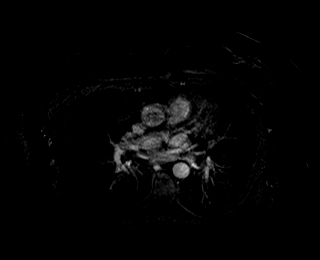

[Series 15: T1 dynamic fat-sat post-contrast · axial · 3.0mm · 1.19mm/px · z∈[-118,+118]mm · 3 of 80 slices shown (3 of 4)]
[im 1/80]
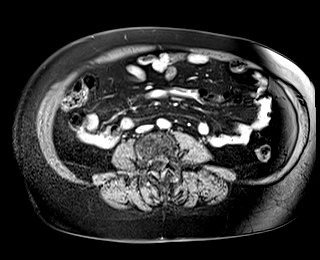
[im 40/80]
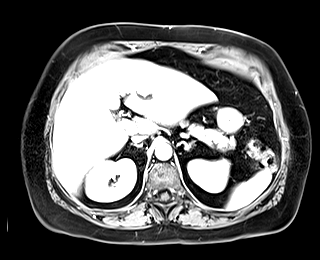
[im 80/80]
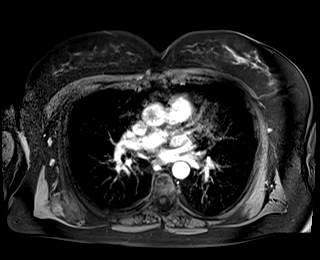

[Series 16: T1 dynamic fat-sat · axial · 3.0mm · 1.19mm/px · z∈[-118,+118]mm · 3 of 80 slices shown (4 of 5)]
[im 1/80]
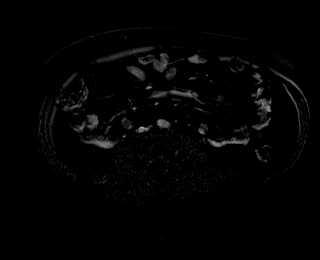
[im 40/80]
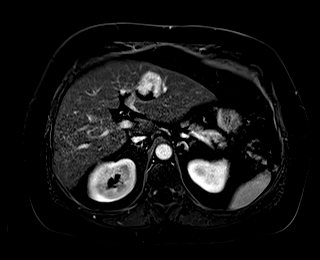
[im 80/80]
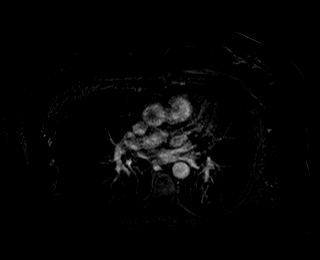

[Series 17: T1 dynamic post-contrast · coronal · 3.0mm · 1.31mm/px · 3 of 72 slices shown]
[im 1/72]
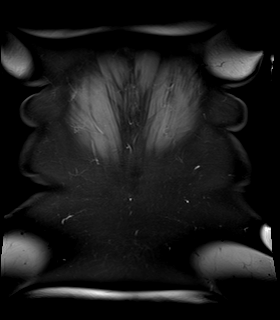
[im 36/72]
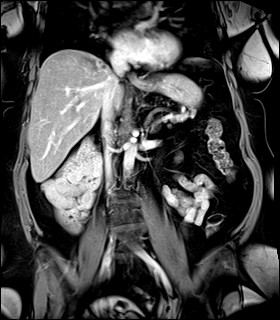
[im 72/72]
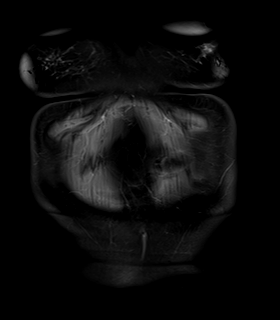

[Series 18: T1 dynamic fat-sat post-contrast · axial · 3.0mm · 1.19mm/px · z∈[-118,+118]mm · 3 of 80 slices shown (4 of 4)]
[im 1/80]
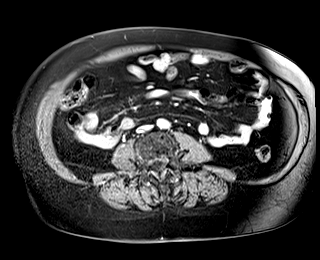
[im 40/80]
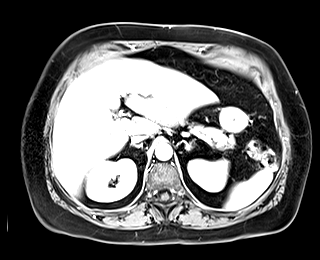
[im 80/80]
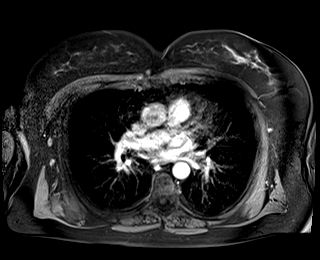

[Series 19: T1 dynamic fat-sat · axial · 3.0mm · 1.19mm/px · z∈[-118,-2]mm · 2 of 80 slices shown (5 of 5)]
[im 1/80]
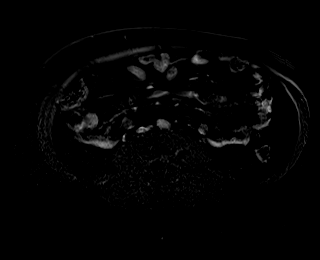
[im 40/80]
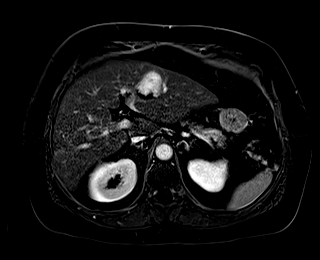

[47 of 48 positions shown; findings below may reference images not displayed]

FINDINGS: Lower chest: Lung bases are clear.

Hepatobiliary: No morphologic findings of cirrhosis. Mild hepatic
steatosis. Multiple hepatic lesions in both lobes which demonstrate
T2 hyperintensity and peripheral nodular discontinuous enhancement,
compatible with benign hemangiomas, including:

--2.8 cm lesion in segment 7 (series 5/image 10)

--3.3 cm lesion in segment 4A (series 5/image 10)

--3.7 cm lesion in segment 3 (series 5/image 18)

Gallbladder is unremarkable. No intrahepatic or extrahepatic ductal
dilatation.

Pancreas:  Within normal limits.

Spleen: Subcentimeter cyst in the anterior spleen (series 5/image
9).

Adrenals/Urinary Tract:  Adrenal glands are within normal limits.

3.1 cm anterior right lower pole renal cyst (series 5/image 26). 5
mm fat density lesion in the medial right upper kidney favors a
benign renal angiomyolipoma (series 5/image 20). Left kidney is
within normal limits. No hydronephrosis.

Stomach/Bowel: Stomach is within normal limits.

Visualized bowel is grossly unremarkable.

Vascular/Lymphatic:  No evidence of abdominal aortic aneurysm.

No suspicious abdominal lymphadenopathy.

Other:  No abdominal ascites.

Musculoskeletal: Mild degenerative changes of the lumbar spine.
IMPRESSION: Multiple benign hemangiomas, as described above. If clinically
warranted, a single follow-up MRI abdomen with/without contrast can
be performed in 6 months.

Mild hepatic steatosis.

3.1 cm right lower pole renal cyst, benign (Bosniak I). 5 mm benign
right upper pole renal angiomyolipoma.

## 2019-04-28 MED ORDER — GADOBUTROL 1 MMOL/ML IV SOLN
7.5000 mL | Freq: Once | INTRAVENOUS | Status: AC | PRN
  Administered 2019-04-28: 15:00:00 7.5 mL via INTRAVENOUS

## 2019-05-07 ENCOUNTER — Other Ambulatory Visit: Payer: BC Managed Care – PPO

## 2019-05-13 ENCOUNTER — Ambulatory Visit: Payer: BC Managed Care – PPO | Attending: Internal Medicine

## 2019-05-13 DIAGNOSIS — Z23 Encounter for immunization: Secondary | ICD-10-CM | POA: Insufficient documentation

## 2019-05-13 NOTE — Progress Notes (Signed)
   Covid-19 Vaccination Clinic  Name:  Keniesha Cravener    MRN: [id number] DOB: 1955-08-13  05/13/2019  Ms. Pitner was observed post Covid-19 immunization for 15 minutes without incident. She was provided with Vaccine Information Sheet and instruction to access the V-Safe system.   Ms. Weyer was instructed to call 911 with any severe reactions post vaccine: Marland Kitchen Difficulty breathing  . Swelling of face and throat  . A fast heartbeat  . A bad rash all over body  . Dizziness and weakness   Immunizations Administered    Name Date Dose VIS Date Route   Pfizer COVID-19 Vaccine 05/13/2019 10:08 AM 0.3 mL 02/17/2019 Intramuscular   Manufacturer: [place]   Lot: [id number]   [place]: [id number]

## 2019-06-01 ENCOUNTER — Other Ambulatory Visit: Payer: Self-pay | Admitting: Physician Assistant

## 2019-06-01 DIAGNOSIS — Z1231 Encounter for screening mammogram for malignant neoplasm of breast: Secondary | ICD-10-CM

## 2019-06-26 ENCOUNTER — Other Ambulatory Visit: Payer: Self-pay

## 2019-06-26 ENCOUNTER — Ambulatory Visit
Admission: RE | Admit: 2019-06-26 | Discharge: 2019-06-26 | Disposition: A | Discharge status: Home / Self Care | Payer: BC Managed Care – PPO | Source: Ambulatory Visit | Attending: Physician Assistant | Admitting: Physician Assistant

## 2019-06-26 DIAGNOSIS — Z1231 Encounter for screening mammogram for malignant neoplasm of breast: Secondary | ICD-10-CM | POA: Diagnosis present

## 2019-06-26 IMAGING — MG DIGITAL SCREENING BILAT W/ TOMO W/ CAD
8 series · 8 of 24 positions shown · non-contrast
Comparison: Previous exam(s).

CLINICAL DATA: Screening.

EXAM:
DIGITAL SCREENING BILATERAL MAMMOGRAM WITH TOMO AND CAD

[L CC synth-2D]
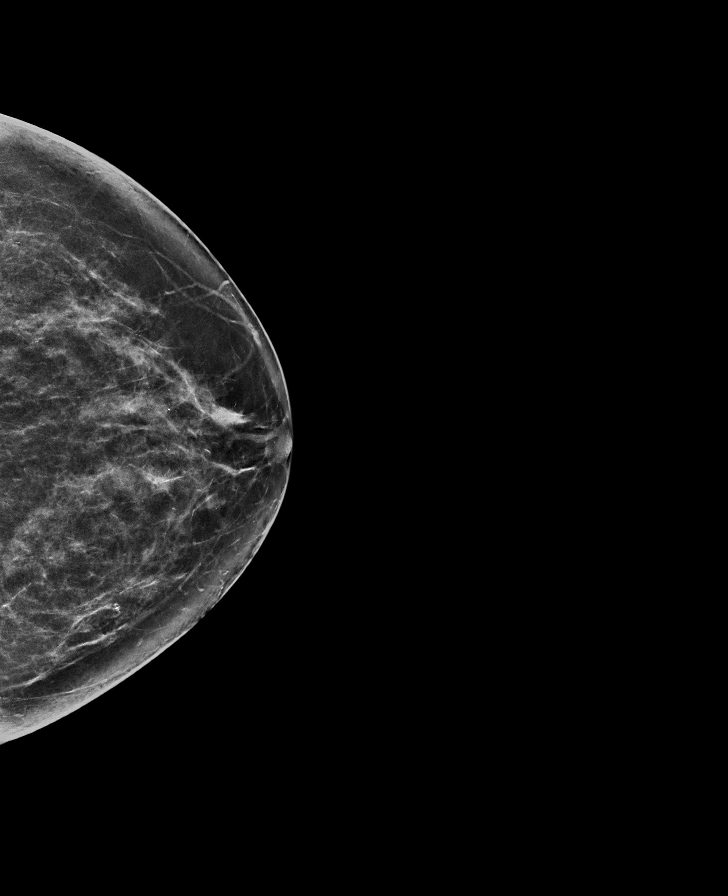

[L MLO synth-2D]
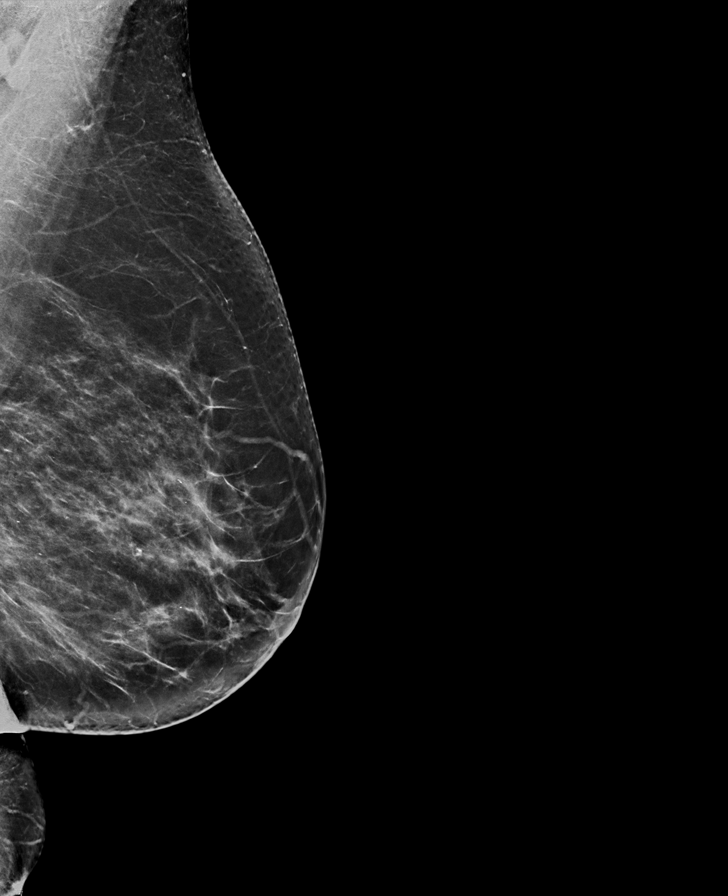

[R MLO synth-2D]
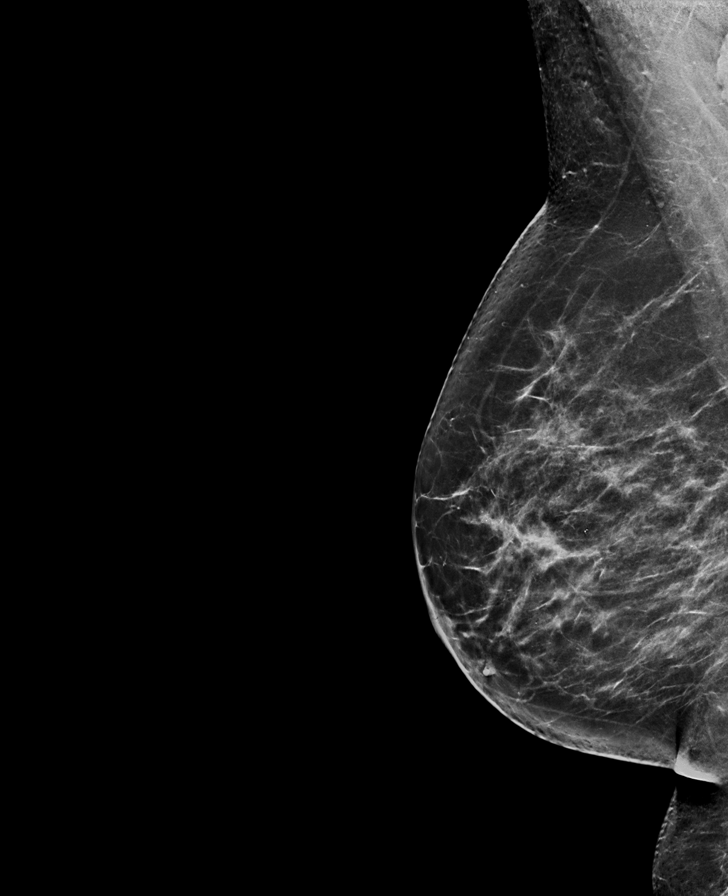

[R CC synth-2D]
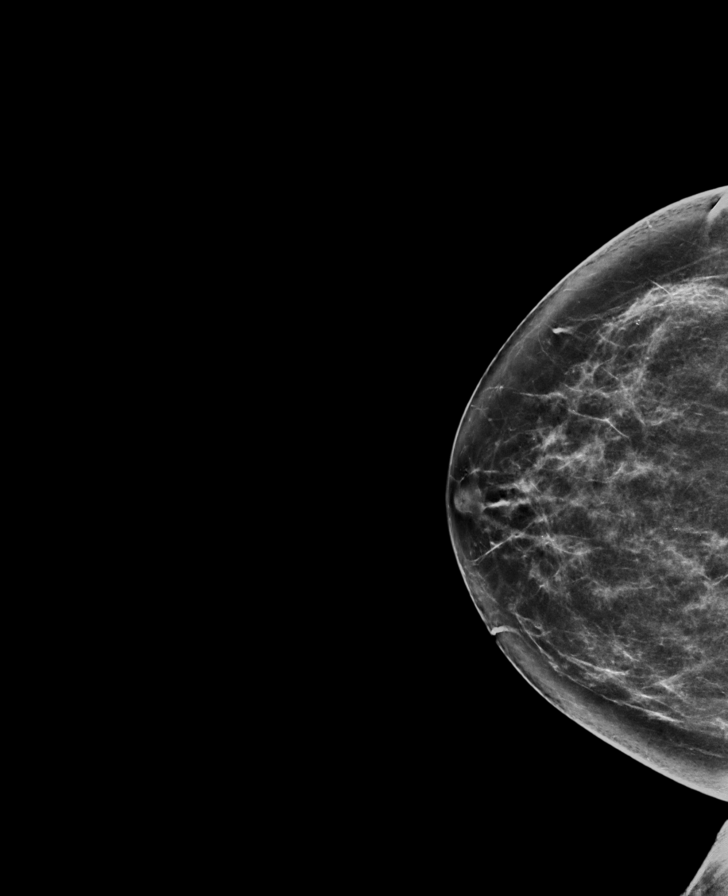

[R CC tomo · tomo slice 37/74.0]
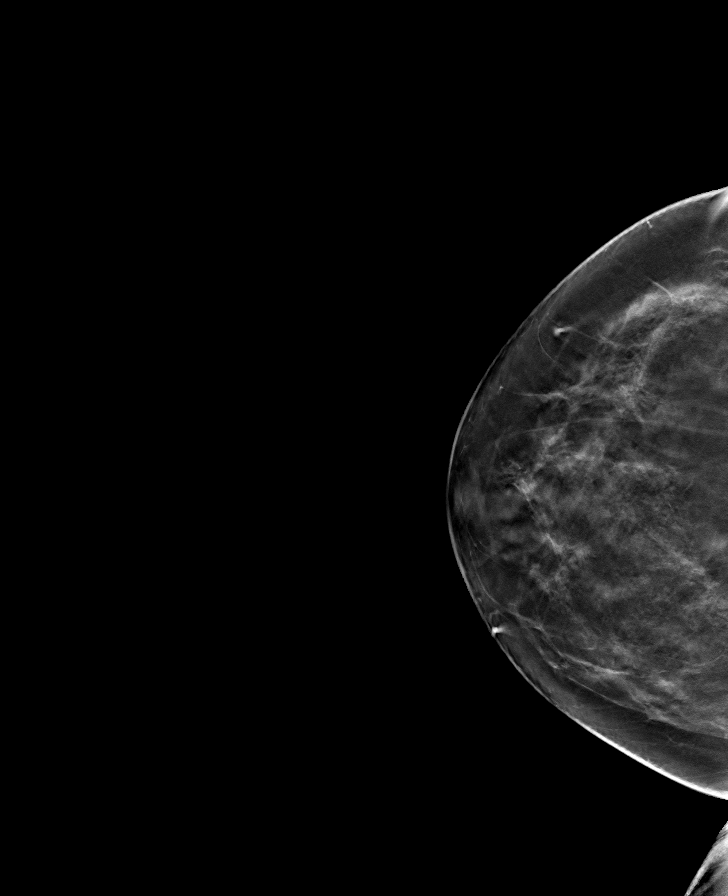

[R MLO tomo · tomo slice 38/75.0]
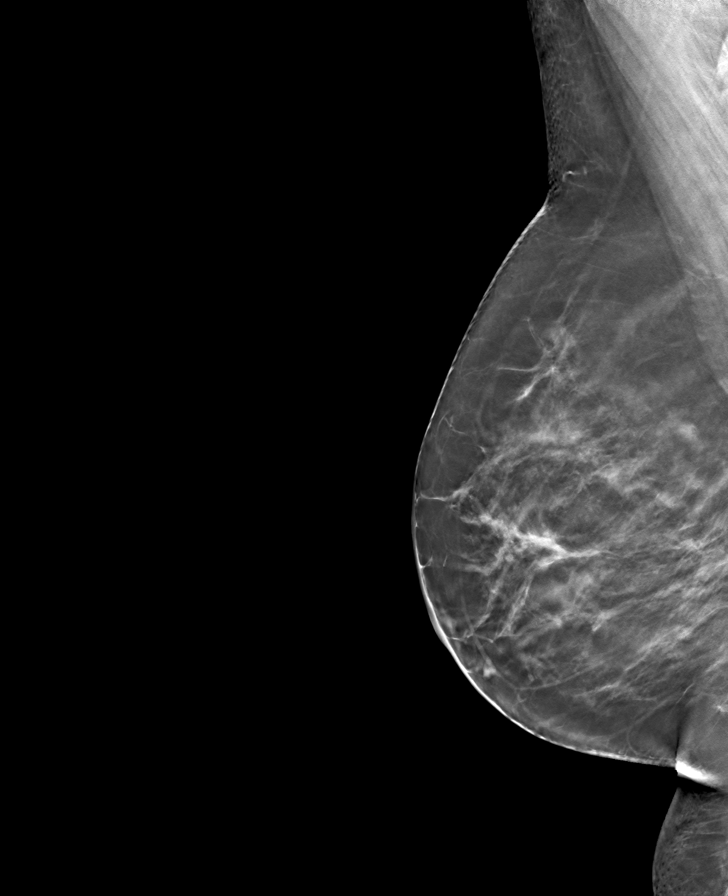

[L CC tomo · tomo slice 37/74.0]
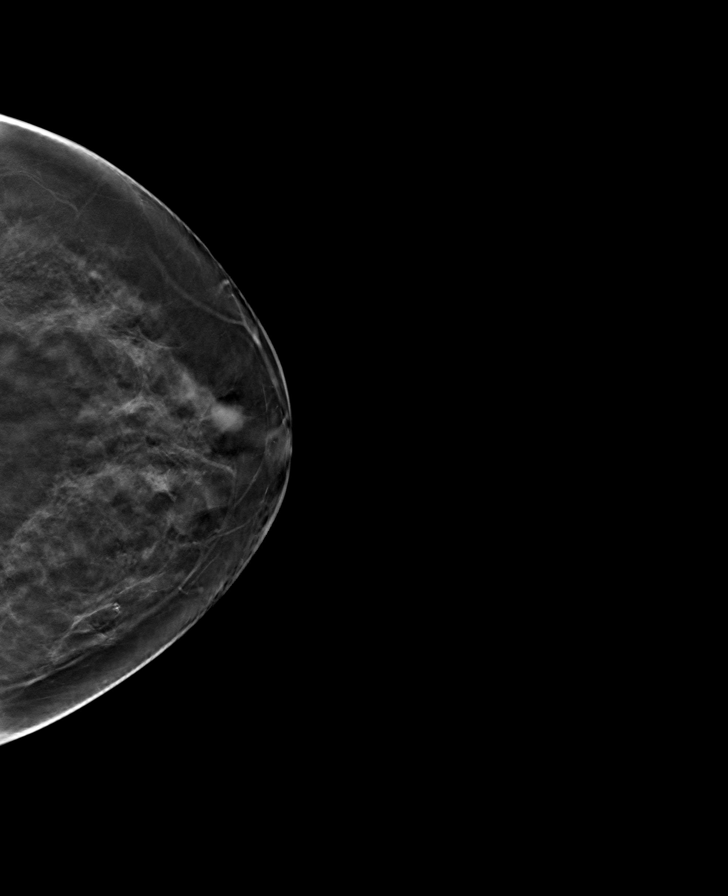

[L MLO tomo · tomo slice 39/77.0]
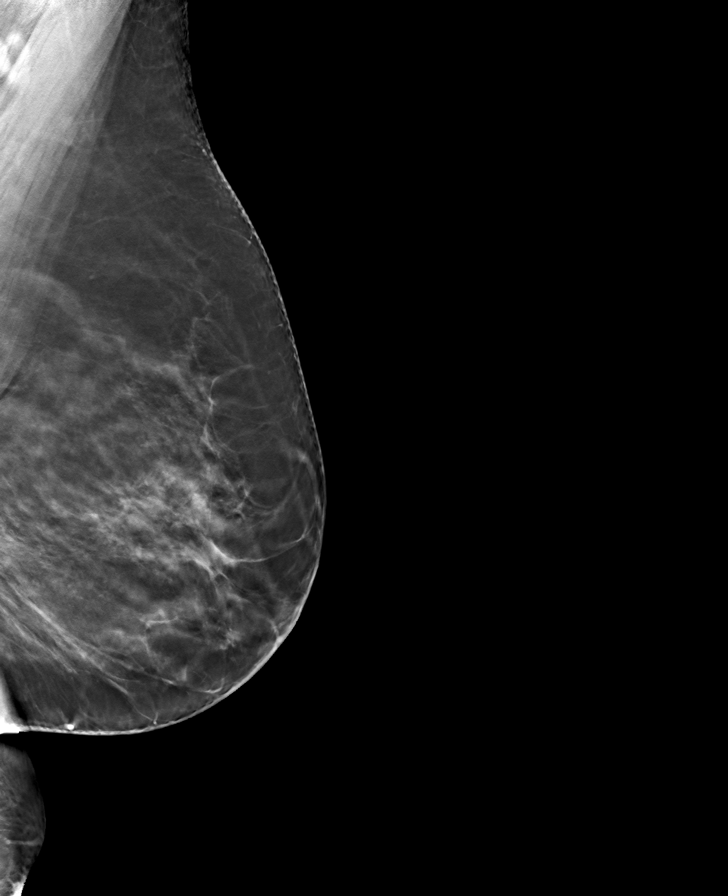

[8 of 24 positions shown; findings below may reference images not displayed]

ACR Breast Density Category c: The breast tissue is heterogeneously
dense, which may obscure small masses.
FINDINGS: There are no findings suspicious for malignancy. Images were
processed with CAD.
IMPRESSION: No mammographic evidence of malignancy. A result letter of this
screening mammogram will be mailed directly to the patient.

RECOMMENDATION:
Screening mammogram in one year. (Code:[5V])

BI-RADS CATEGORY  1: Negative.

## 2019-11-20 ENCOUNTER — Other Ambulatory Visit: Payer: Self-pay | Admitting: Gastroenterology

## 2019-11-20 DIAGNOSIS — D1803 Hemangioma of intra-abdominal structures: Secondary | ICD-10-CM

## 2019-12-07 ENCOUNTER — Ambulatory Visit: Payer: BC Managed Care – PPO

## 2019-12-12 ENCOUNTER — Other Ambulatory Visit: Payer: Self-pay | Admitting: Physician Assistant

## 2019-12-12 DIAGNOSIS — R911 Solitary pulmonary nodule: Secondary | ICD-10-CM

## 2020-02-15 ENCOUNTER — Other Ambulatory Visit: Payer: Self-pay

## 2020-02-15 ENCOUNTER — Ambulatory Visit
Admission: RE | Admit: 2020-02-15 | Discharge: 2020-02-15 | Disposition: A | Discharge status: Home / Self Care | Payer: 59 | Source: Ambulatory Visit | Attending: Physician Assistant | Admitting: Physician Assistant

## 2020-02-15 DIAGNOSIS — R911 Solitary pulmonary nodule: Secondary | ICD-10-CM | POA: Diagnosis present

## 2020-02-15 IMAGING — CT CT CHEST W/O CM
1 series · 15 of 34 positions shown, 19 images · non-contrast
Comparison: None.

CLINICAL DATA: F/u [DATE] NM study that seen lung nodule per pt.
NKI. No hx of cancer/surg. Non smoker.

EXAM:
CT CHEST WITHOUT CONTRAST
TECHNIQUE: Multidetector CT imaging of the chest was performed following the
standard protocol without IV contrast.

[Series 2: thorax · axial · 0.67mm/px · z∈[-669,-417]mm · 15 of 148 slices shown, 19 images]
[im 11/148  mediastinal]
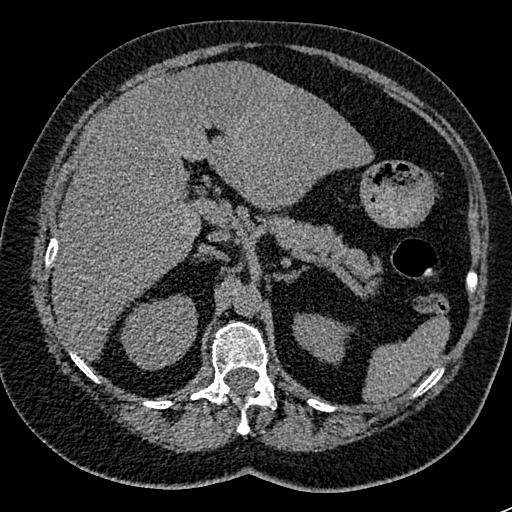
[im 11/148  lung]
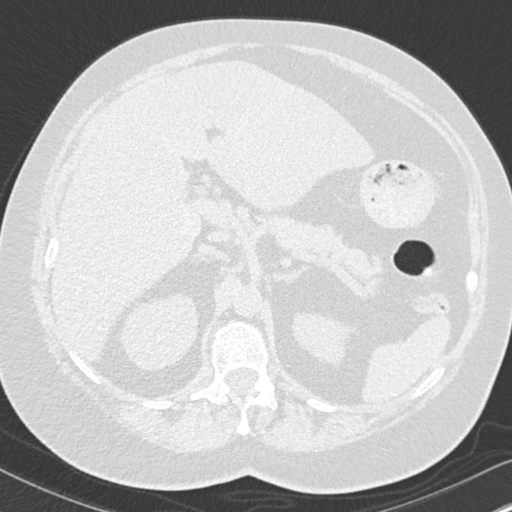
[im 22/148  lung]
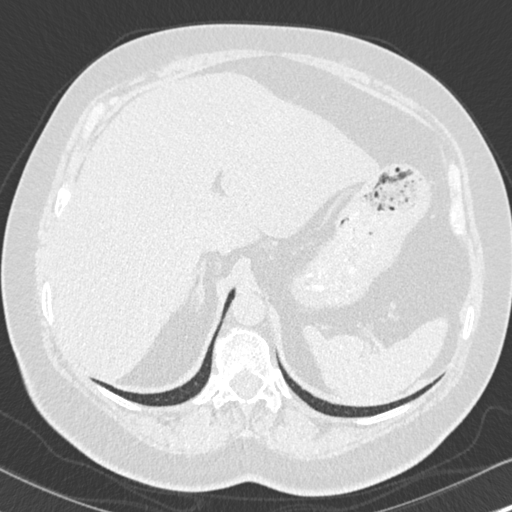
[im 30/148  lung]
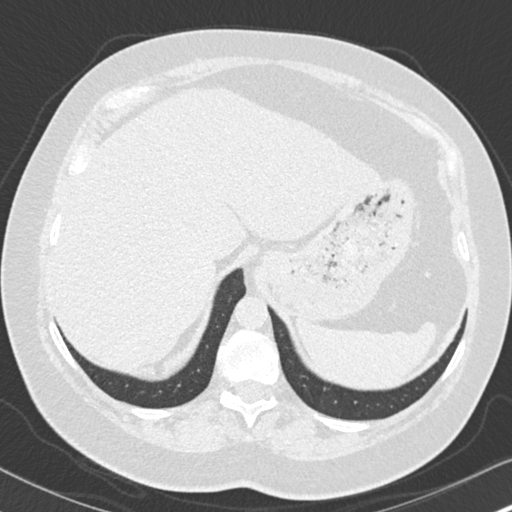
[im 39/148  lung]
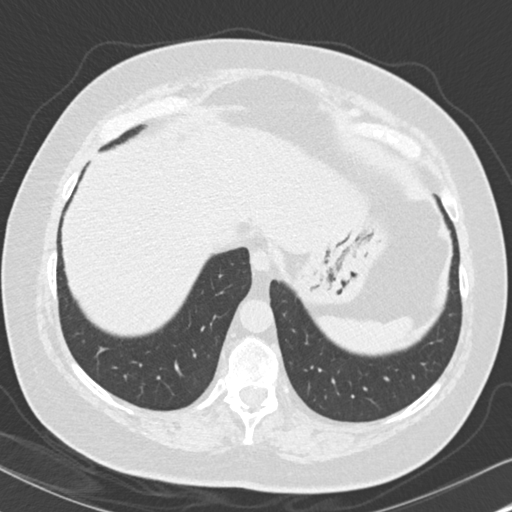
[im 50/148  mediastinal]
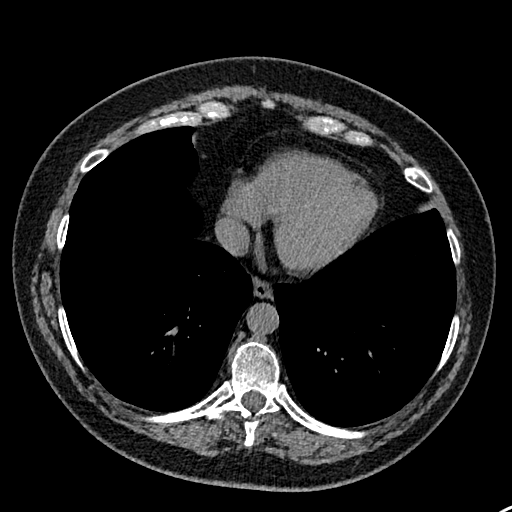
[im 50/148  lung]
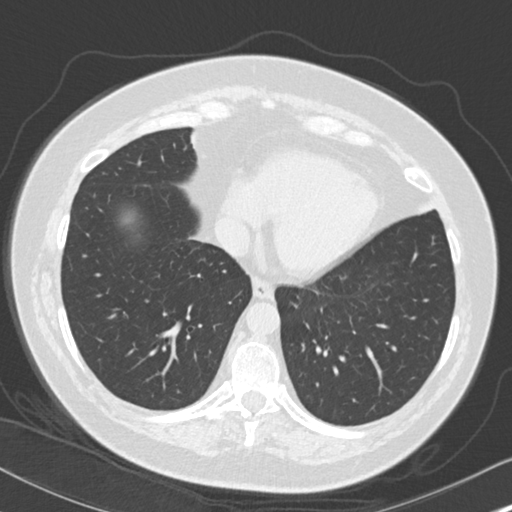
[im 59/148  lung]
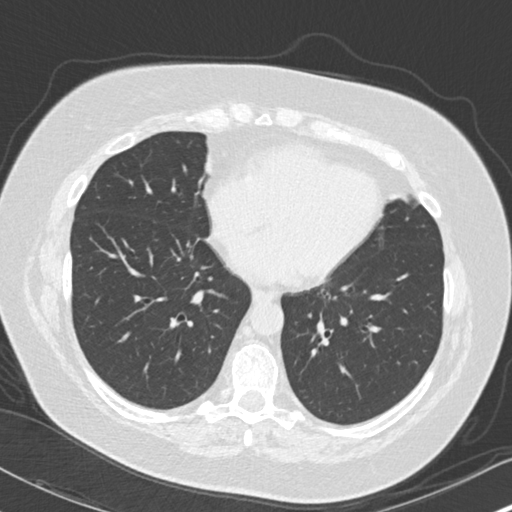
[im 66/148  lung]
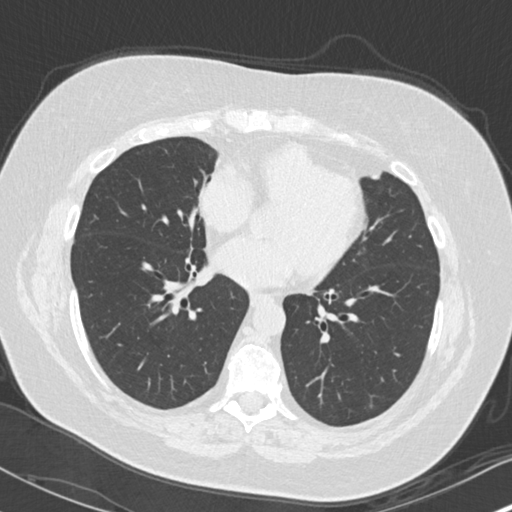
[im 77/148  lung]
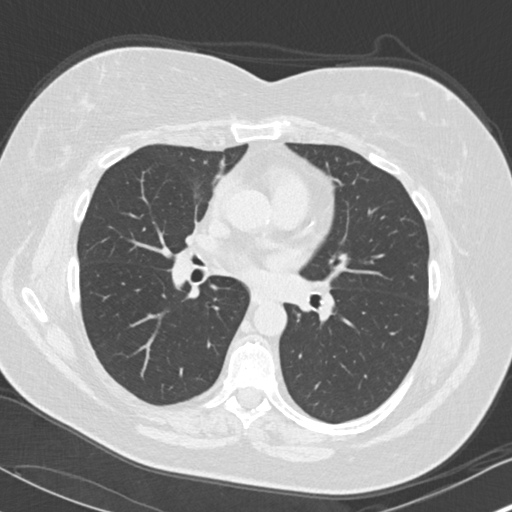
[im 82/148  mediastinal]
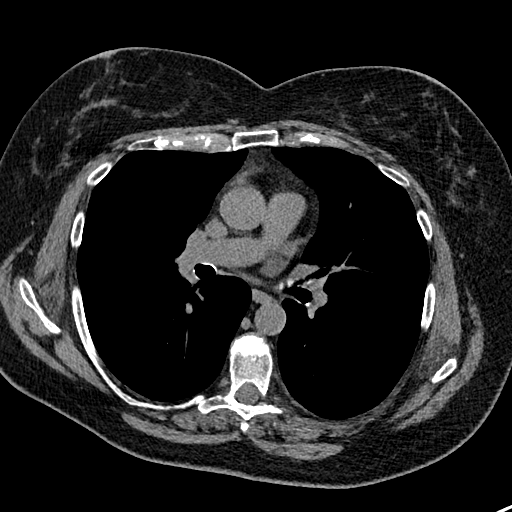
[im 82/148  lung]
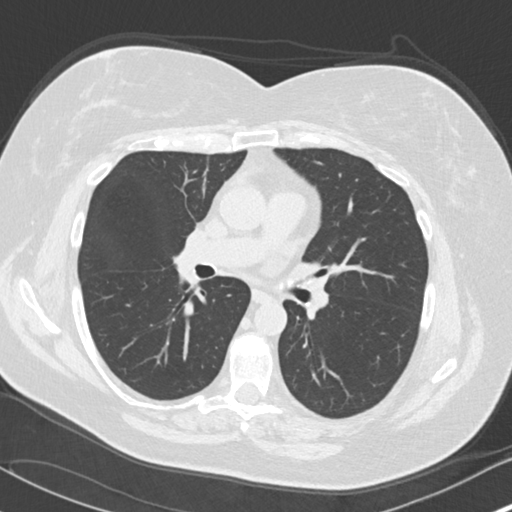
[im 89/148  lung]
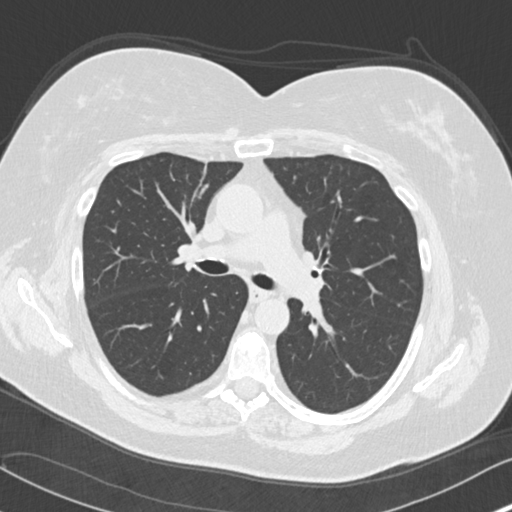
[im 99/148  lung]
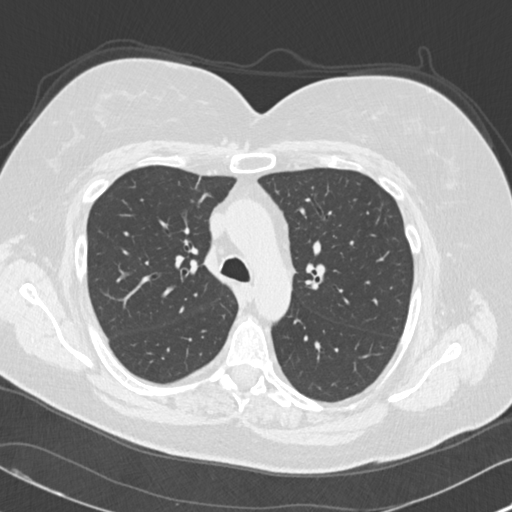
[im 109/148  lung]
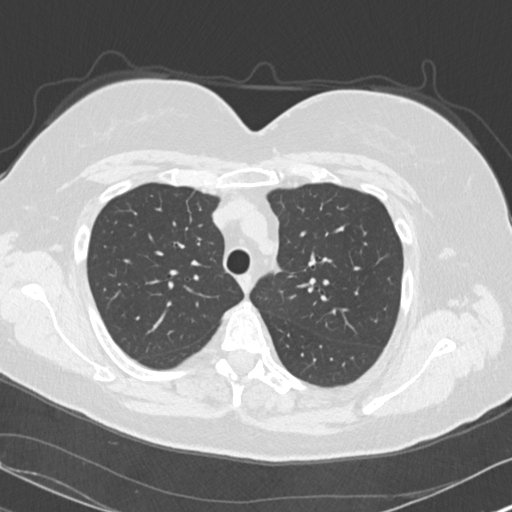
[im 118/148  mediastinal]
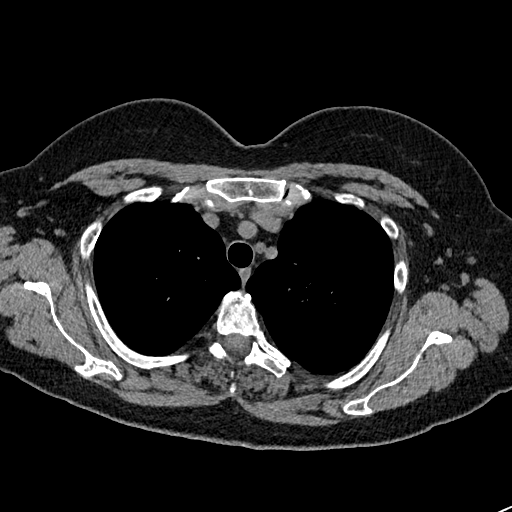
[im 118/148  lung]
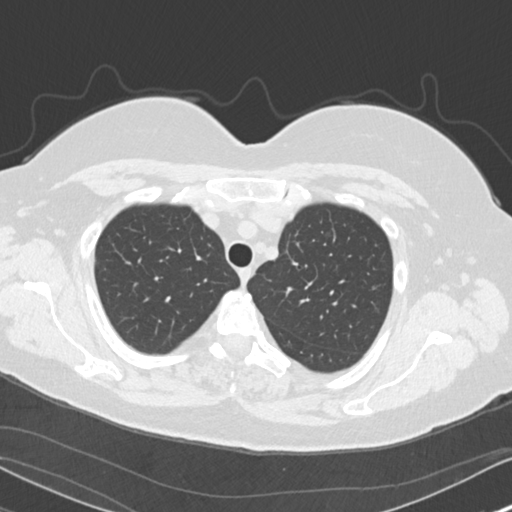
[im 126/148  lung]
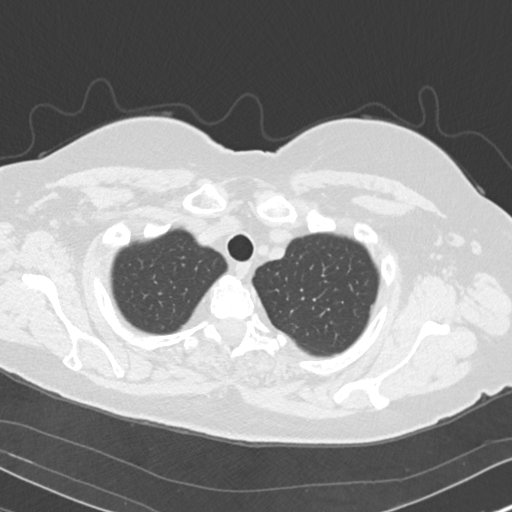
[im 137/148  lung]
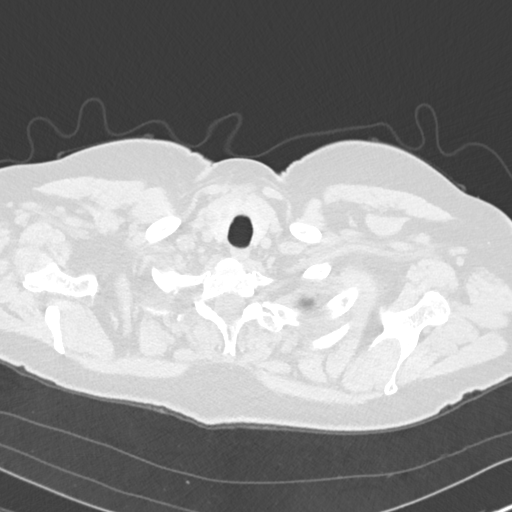

[15 of 34 positions shown; findings below may reference images not displayed]

FINDINGS: Cardiovascular: Heart is normal in size and configuration. No
pericardial effusion. Mild left coronary artery calcifications.
Great vessels are normal in caliber. No significant aortic
atherosclerotic calcifications.

Mediastinum/Nodes: Subcentimeter right thyroid nodules. No follow-up
indicated. No neck base, axillary, mediastinal or hilar masses or
enlarged lymph nodes. Trachea and esophagus are unremarkable.

Lungs/Pleura: 3-4 mm nodule, right middle lobe, image 88, series 3.

Mild opacity at the anteromedial bases of the right middle lobe and
left upper lobe lingula consistent with scarring or atelectasis.

Lungs otherwise clear.  No pleural effusion or pneumothorax.

Upper Abdomen: No acute findings. Decreased attenuation of the liver
consistent with fatty infiltration.

Musculoskeletal: No fracture or acute finding. No osteoblastic or
osteolytic lesions.
IMPRESSION: 1. No acute findings.
2. 3-4 mm right middle lobe nodule. No follow-up needed if patient
is low-risk. Non-contrast chest CT can be considered in 12 months if
patient is high-risk. This recommendation follows the consensus
statement: Guidelines for Management of Incidental Pulmonary Nodules
Detected on CT Images: From the [HOSPITAL] [22]; Radiology
3. Mild left coronary artery calcifications.
4. Hepatic steatosis.

## 2020-05-20 ENCOUNTER — Other Ambulatory Visit: Payer: Self-pay | Admitting: Gastroenterology

## 2020-05-20 DIAGNOSIS — R160 Hepatomegaly, not elsewhere classified: Secondary | ICD-10-CM

## 2020-05-20 DIAGNOSIS — D1803 Hemangioma of intra-abdominal structures: Secondary | ICD-10-CM

## 2020-06-05 ENCOUNTER — Ambulatory Visit
Admission: RE | Admit: 2020-06-05 | Discharge: 2020-06-05 | Disposition: A | Discharge status: Home / Self Care | Payer: BC Managed Care – PPO | Source: Ambulatory Visit | Attending: Gastroenterology | Admitting: Gastroenterology

## 2020-06-05 ENCOUNTER — Other Ambulatory Visit: Payer: Self-pay

## 2020-06-05 ENCOUNTER — Other Ambulatory Visit: Payer: Self-pay | Admitting: Physician Assistant

## 2020-06-05 DIAGNOSIS — D1803 Hemangioma of intra-abdominal structures: Secondary | ICD-10-CM | POA: Insufficient documentation

## 2020-06-05 DIAGNOSIS — Z1231 Encounter for screening mammogram for malignant neoplasm of breast: Secondary | ICD-10-CM

## 2020-06-05 DIAGNOSIS — R160 Hepatomegaly, not elsewhere classified: Secondary | ICD-10-CM | POA: Diagnosis present

## 2020-06-05 IMAGING — MR MR ABDOMEN WO/W CM
17 series · 46 of 48 positions shown · IV contrast (7ml Gadavist)
Comparison: [DATE] MRI abdomen.

CLINICAL DATA: Fall liver masses, characterized as hemangiomas on
prior MRI.

EXAM:
MRI ABDOMEN WITHOUT AND WITH CONTRAST
TECHNIQUE: Multiplanar multisequence MR imaging of the abdomen was performed
both before and after the administration of intravenous contrast.
CONTRAST:  7mL GADAVIST GADOBUTROL 1 MMOL/ML IV SOLN

[Series 3: T2 · coronal · 6.0mm · 1.19mm/px · 2 of 30 slices shown (1 of 2)]
[im 1/30]
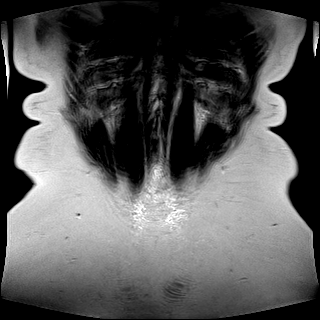
[im 30/30]
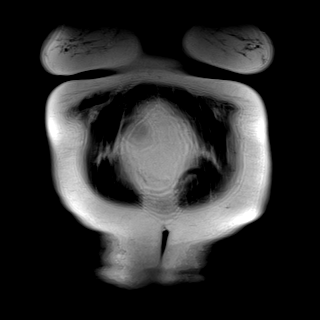

[Series 4: T2 · axial · 6.0mm · 1.19mm/px · z∈[-29,+194]mm · 2 of 32 slices shown (2 of 2)]
[im 1/32]
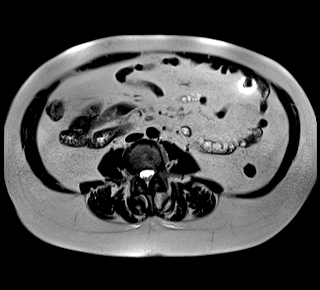
[im 32/32]
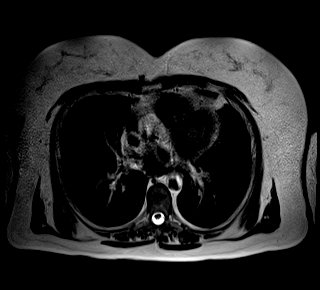

[Series 6: T2 fat-sat · axial · 6.0mm · 1.19mm/px · z∈[-36,+201]mm · 2 of 34 slices shown]
[im 1/34]
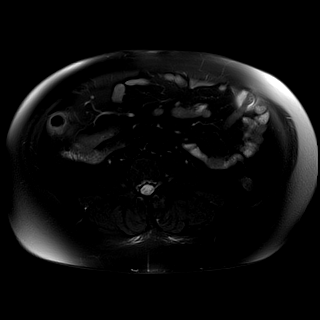
[im 34/34]
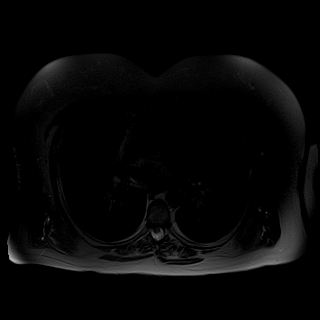

[Series 7: ax dwi_tracew · axial · 6.0mm · 1.42mm/px · z∈[-36,+201]mm · 5 of 102 slices shown]
[im 1/102]
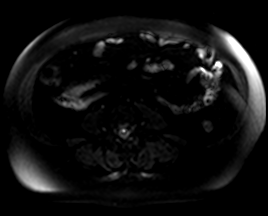
[im 26/102]
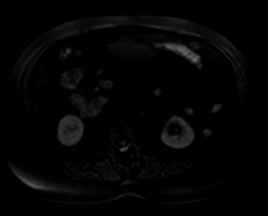
[im 51/102]
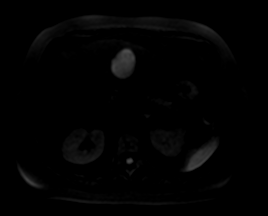
[im 76/102]
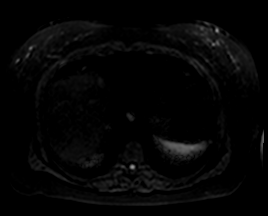
[im 102/102]
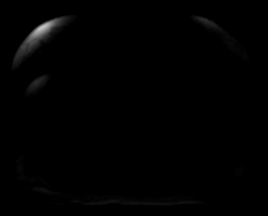

[Series 8: ax dwi_adc · axial · 6.0mm · 1.42mm/px · z∈[-36,+201]mm · 2 of 34 slices shown]
[im 1/34]
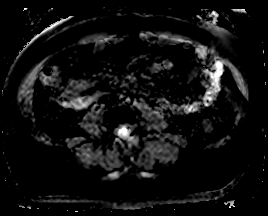
[im 34/34]
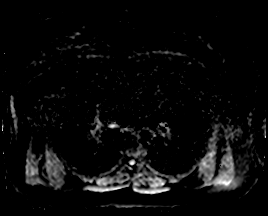

[Series 9: T1 · axial · 6.0mm · 0.74mm/px · z∈[-29,+194]mm · 4 of 64 slices shown]
[im 1/64]
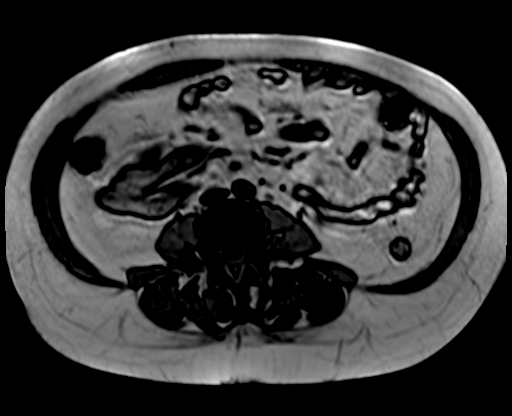
[im 22/64]
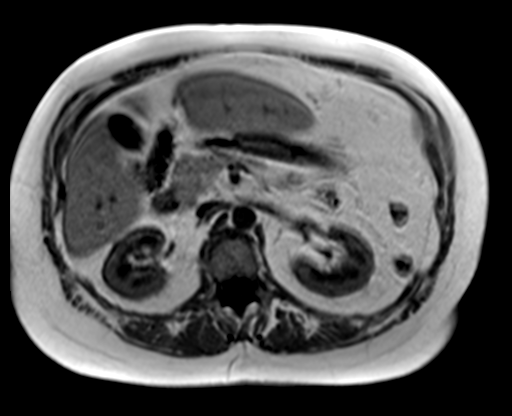
[im 43/64]
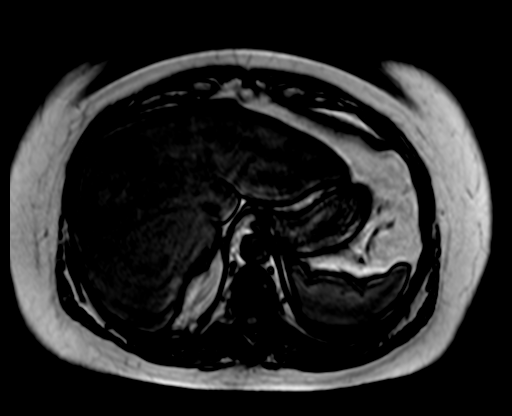
[im 64/64]
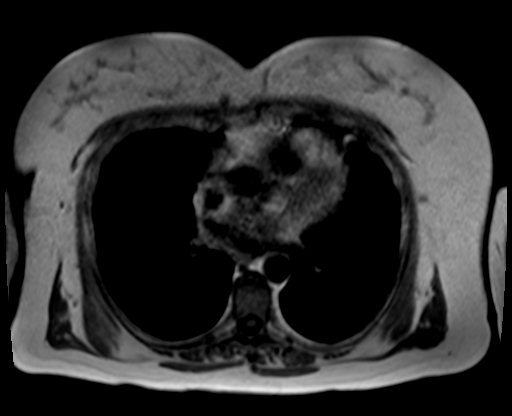

[Series 10: bSSFP · axial · 6.0mm · 0.74mm/px · 1 of 32 slices shown]
[im 1/32]
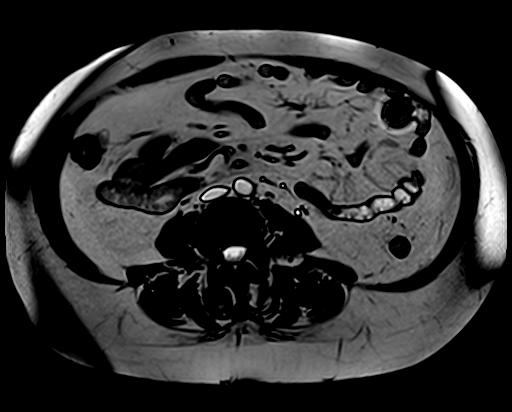

[Series 11: T1 dynamic fat-sat · axial · non-contrast · 3.0mm · 1.19mm/px · z∈[-36,+201]mm · 3 of 80 slices shown (1 of 5)]
[im 1/80]
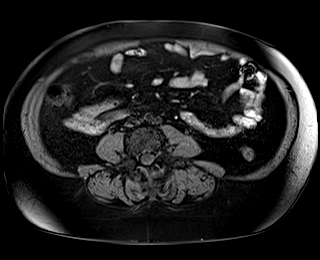
[im 40/80]
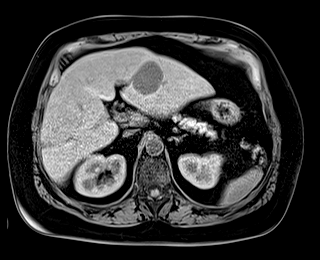
[im 80/80]
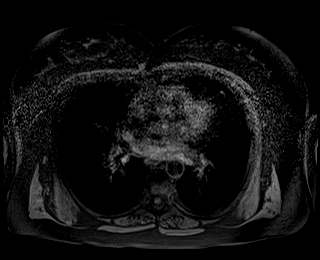

[Series 12: T1 dynamic fat-sat post-contrast · axial · 3.0mm · 1.19mm/px · z∈[-36,+201]mm · 3 of 80 slices shown (1 of 4)]
[im 1/80]
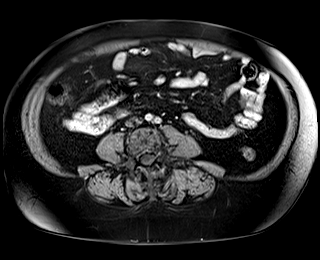
[im 40/80]
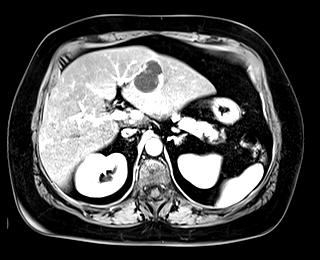
[im 80/80]
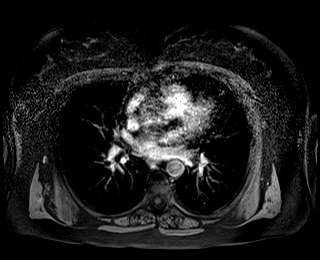

[Series 13: T1 dynamic fat-sat · axial · 3.0mm · 1.19mm/px · z∈[-36,+201]mm · 3 of 80 slices shown (2 of 5)]
[im 1/80]
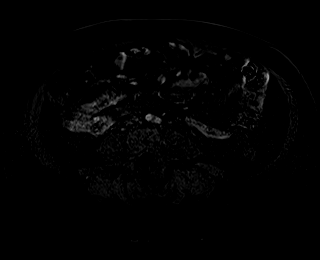
[im 40/80]
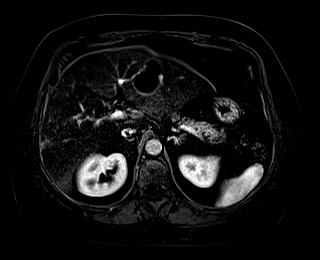
[im 80/80]
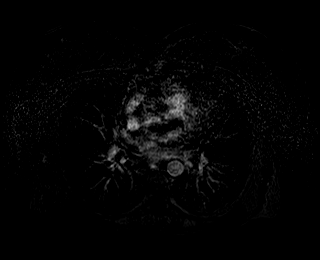

[Series 14: T1 dynamic fat-sat post-contrast · axial · 3.0mm · 1.19mm/px · z∈[-36,+201]mm · 3 of 80 slices shown (2 of 4)]
[im 1/80]
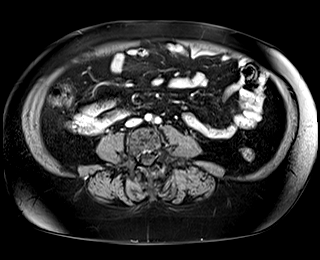
[im 40/80]
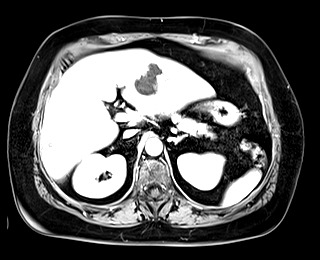
[im 80/80]
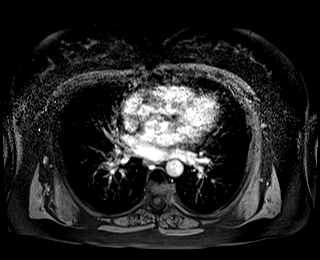

[Series 15: T1 dynamic fat-sat · axial · 3.0mm · 1.19mm/px · z∈[-36,+201]mm · 3 of 80 slices shown (3 of 5)]
[im 1/80]
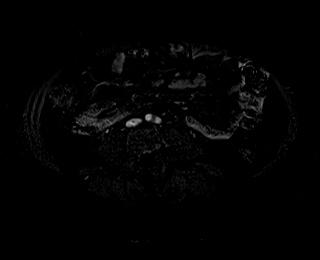
[im 40/80]
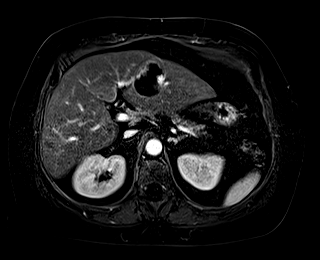
[im 80/80]
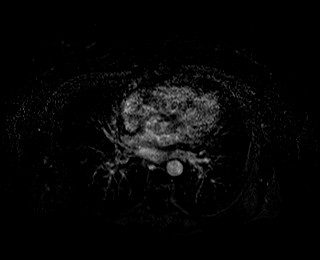

[Series 16: T1 dynamic fat-sat post-contrast · axial · 3.0mm · 1.19mm/px · z∈[-36,+201]mm · 3 of 80 slices shown (3 of 4)]
[im 1/80]
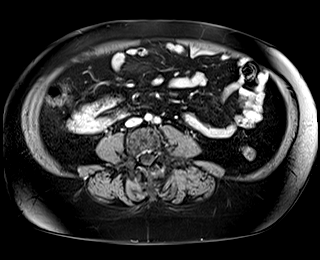
[im 40/80]
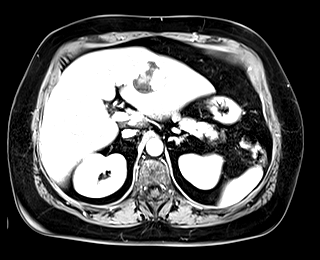
[im 80/80]
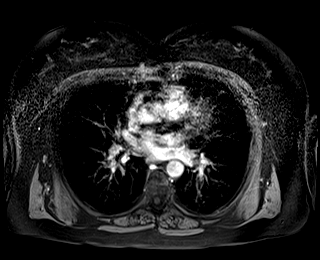

[Series 17: T1 dynamic fat-sat · axial · 3.0mm · 1.19mm/px · z∈[-36,+201]mm · 3 of 80 slices shown (4 of 5)]
[im 1/80]
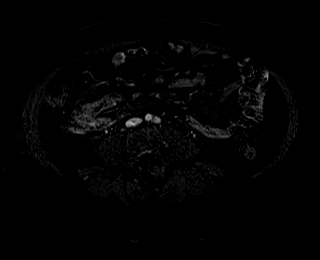
[im 40/80]
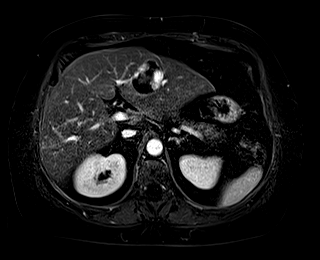
[im 80/80]
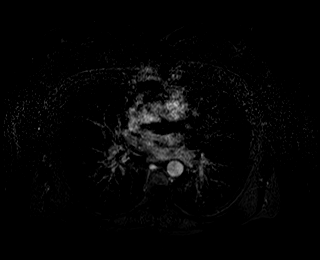

[Series 18: T1 dynamic post-contrast · coronal · 3.0mm · 1.31mm/px · 3 of 72 slices shown]
[im 1/72]
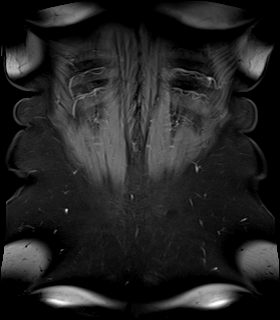
[im 36/72]
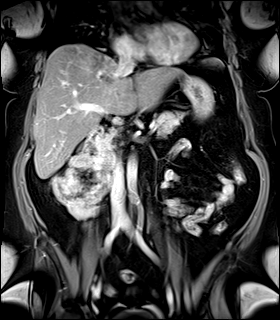
[im 72/72]
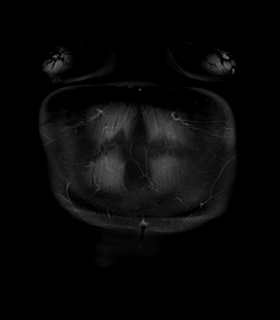

[Series 19: T1 dynamic fat-sat post-contrast · axial · 3.0mm · 1.19mm/px · z∈[-36,+201]mm · 3 of 80 slices shown (4 of 4)]
[im 1/80]
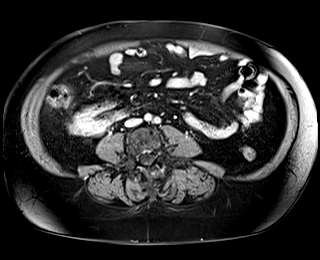
[im 40/80]
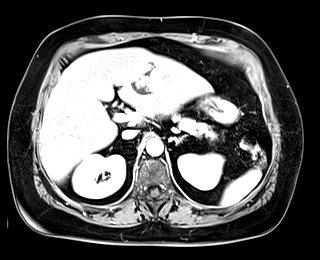
[im 80/80]
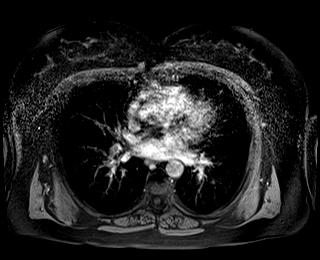

[Series 20: T1 dynamic fat-sat · axial · 3.0mm · 1.19mm/px · 1 of 80 slices shown (5 of 5)]
[im 1/80]
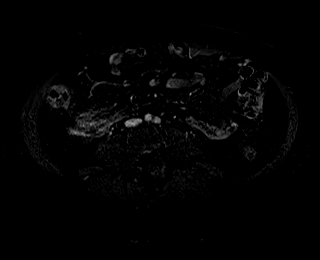

[46 of 48 positions shown; findings below may reference images not displayed]

FINDINGS: Lower chest: No acute abnormality at the lung bases.

Hepatobiliary: Normal liver size and configuration. Moderate diffuse
hepatic steatosis. Numerous (at least 8) mildly T2 hyperintense
liver masses scattered throughout the liver, all demonstrating
progressive discontinuous peripheral nodular enhancement compatible
with benign hemangiomas, not appreciably changed. Representative
x 3.5 cm segment 3 left liver hemangioma (series 6/image 18),
previously 4.0 x 3.4 cm. Representative 0.3 x 3.4 cm segment 4A left
liver hemangioma (series 6/image 10), previously 5.2 x 3.4 cm using
similar measurement technique. Representative 4.2 x 2.4 cm segment 7
right liver hemangioma (series 6/image 9), previously 4.3 x 2.4 cm.
No new or suspicious liver masses. Normal gallbladder with no
cholelithiasis. No biliary ductal dilatation. Common bile duct
diameter 2 mm. No choledocholithiasis. No biliary masses, strictures
or beading.

Pancreas: No pancreatic mass or duct dilation.  No pancreas divisum.

Spleen: Normal size spleen. Mildly T2 hyperintense anterior splenic
0.7 cm lesion (series 4/image 9) without appreciable enhancement,
unchanged since [DATE] MRI, compatible with a benign
lymphangioma. No new splenic lesions.

Adrenals/Urinary Tract: Normal adrenals. No hydronephrosis. Simple
right renal cysts, largest 3.3 cm in the lower right kidney. No
suspicious renal masses. Tiny 0.5 cm angiomyolipoma in the posterior
interpolar right kidney (series 4/image 20) is unchanged.

Stomach/Bowel: Normal non-distended stomach. Visualized small and
large bowel is normal caliber, with no bowel wall thickening.

Vascular/Lymphatic: Normal caliber abdominal aorta. Patent portal,
splenic, hepatic and renal veins. No pathologically enlarged lymph
nodes in the abdomen.

Other: No abdominal ascites or focal fluid collection. Limited
visualization mildly enlarged myomatous uterus.

Musculoskeletal: No aggressive appearing focal osseous lesions.
IMPRESSION: 1. Numerous stable benign liver hemangiomas. No suspicious liver
masses.
2. Moderate diffuse hepatic steatosis.
3. Simple right renal cysts and tiny 0.5 cm angiomyolipoma in the
interpolar right kidney.
4. Limited visualization of mildly enlarged myomatous uterus.

## 2020-06-05 MED ORDER — GADOBUTROL 1 MMOL/ML IV SOLN
7.0000 mL | Freq: Once | INTRAVENOUS | Status: AC | PRN
  Administered 2020-06-05: 7 mL via INTRAVENOUS

## 2020-06-27 ENCOUNTER — Ambulatory Visit
Admission: RE | Admit: 2020-06-27 | Discharge: 2020-06-27 | Disposition: A | Discharge status: Home / Self Care | Payer: BC Managed Care – PPO | Source: Ambulatory Visit | Attending: Physician Assistant | Admitting: Physician Assistant

## 2020-06-27 ENCOUNTER — Other Ambulatory Visit: Payer: Self-pay

## 2020-06-27 DIAGNOSIS — Z1231 Encounter for screening mammogram for malignant neoplasm of breast: Secondary | ICD-10-CM | POA: Diagnosis not present

## 2020-06-27 IMAGING — MG MM DIGITAL SCREENING BILAT W/ TOMO AND CAD
8 series · 8 of 24 positions shown · non-contrast
Comparison: Previous exam(s).

CLINICAL DATA: Screening.

EXAM:
DIGITAL SCREENING BILATERAL MAMMOGRAM WITH TOMOSYNTHESIS AND CAD
TECHNIQUE: Bilateral screening digital craniocaudal and mediolateral oblique
mammograms were obtained. Bilateral screening digital breast
tomosynthesis was performed. The images were evaluated with
computer-aided detection.

[L CC synth-2D]
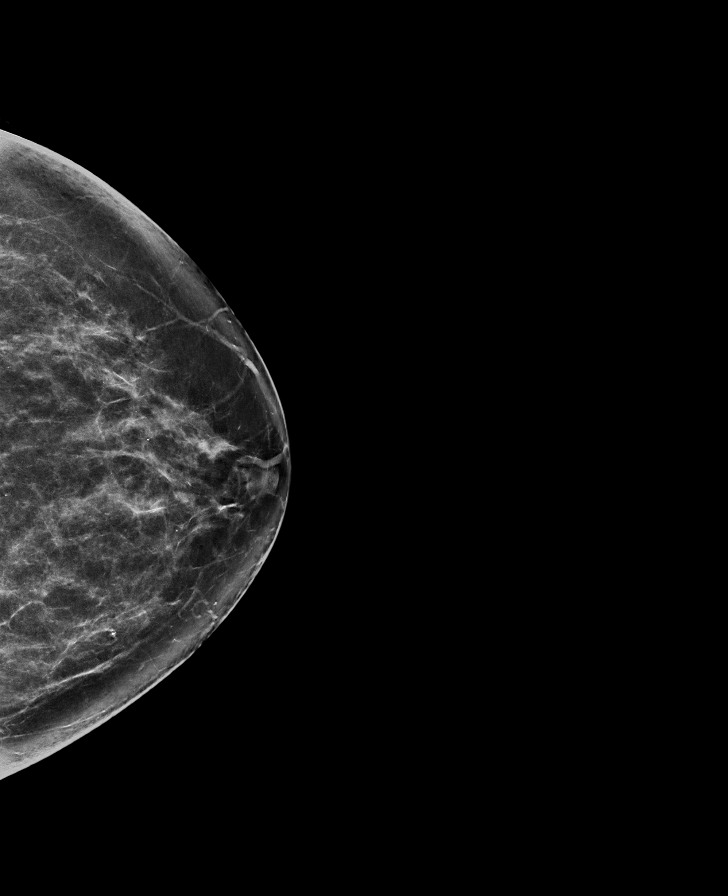

[R CC synth-2D]
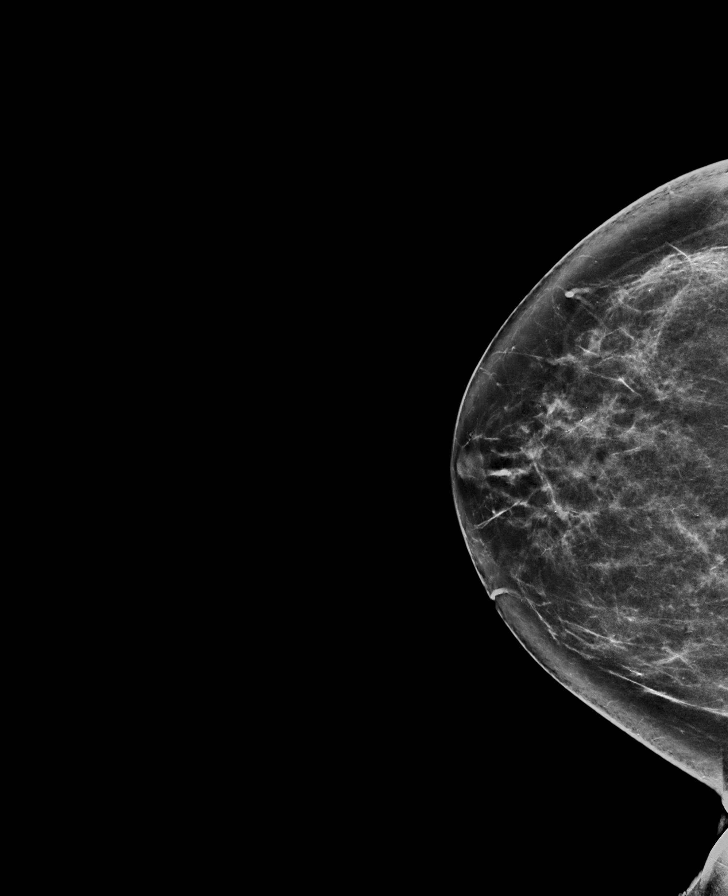

[L MLO synth-2D]
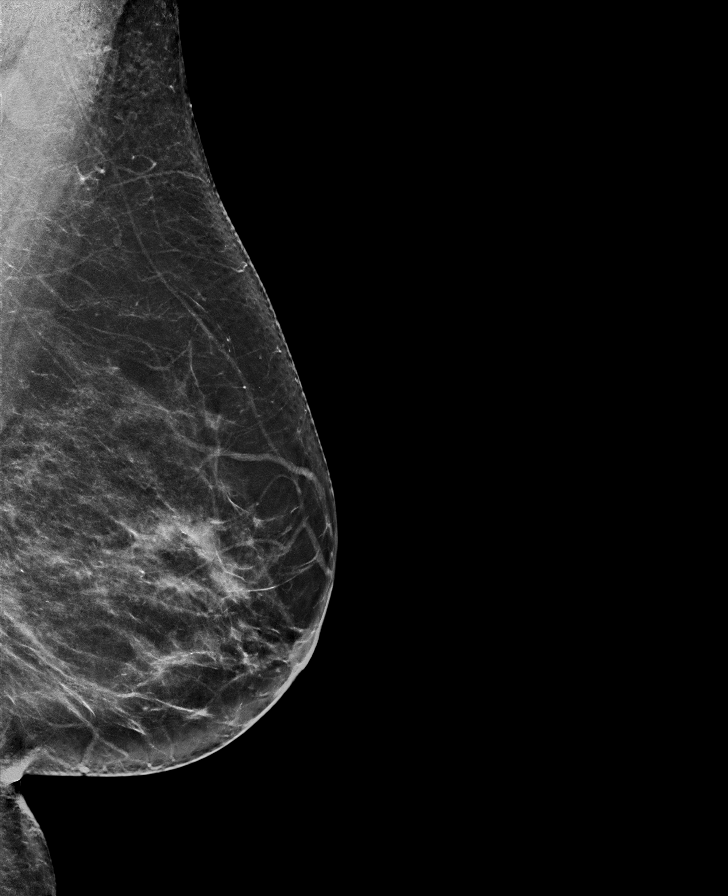

[R MLO synth-2D]
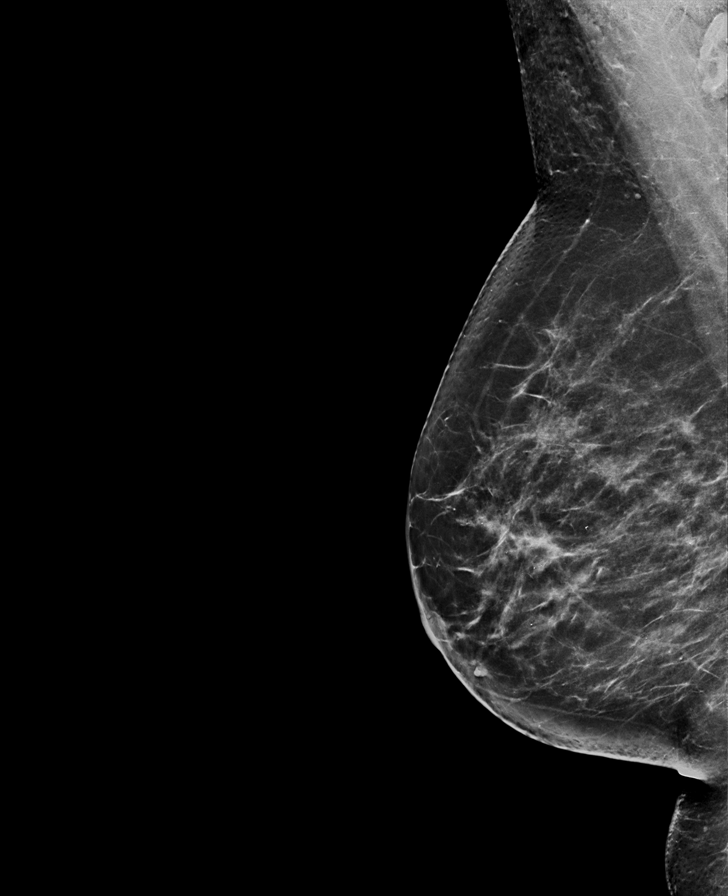

[R CC tomo · tomo slice 39/77.0]
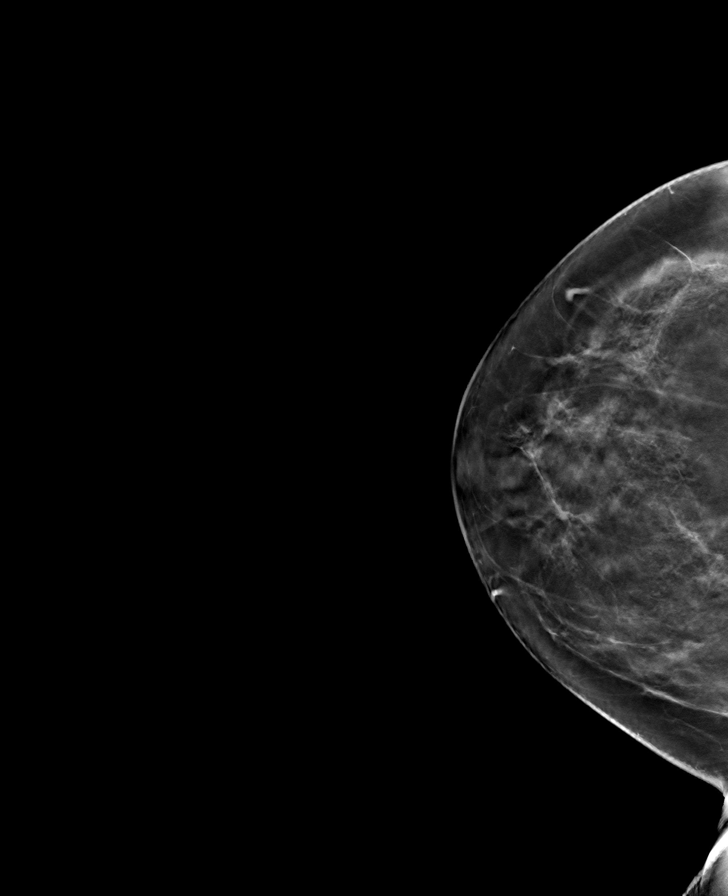

[L MLO tomo · tomo slice 39/78.0]
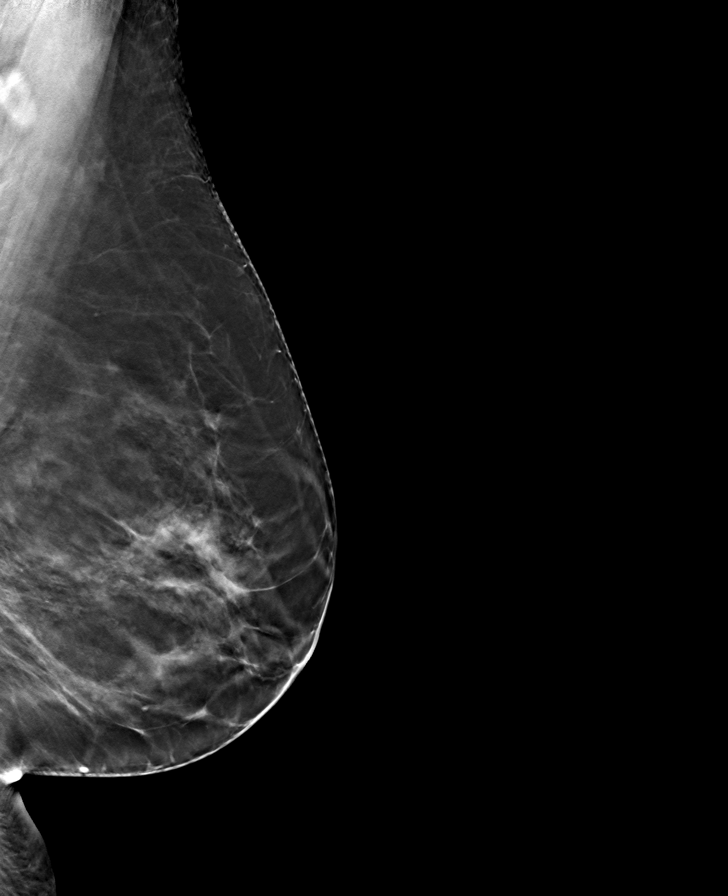

[R MLO tomo · tomo slice 39/78.0]
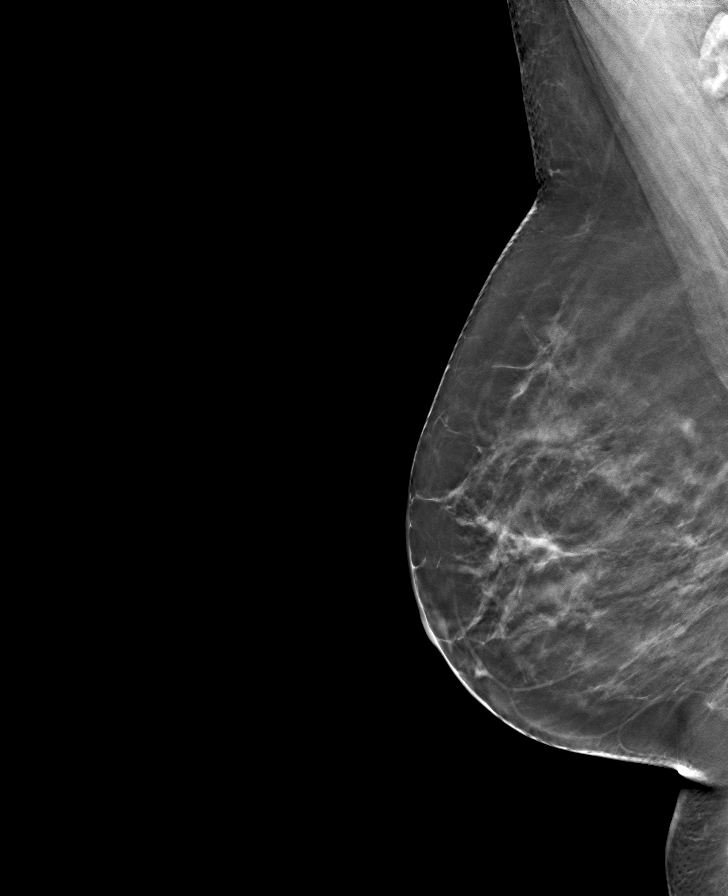

[L CC tomo · tomo slice 39/78.0]
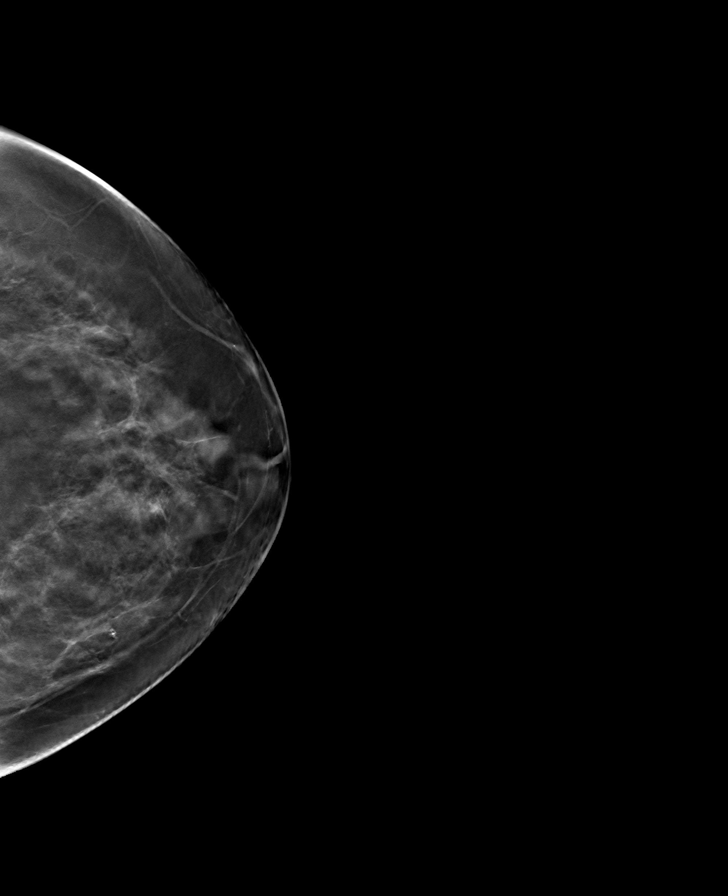

[8 of 24 positions shown; findings below may reference images not displayed]

ACR Breast Density Category b: There are scattered areas of
fibroglandular density.
FINDINGS: There are no findings suspicious for malignancy. The images were
evaluated with computer-aided detection.
IMPRESSION: No mammographic evidence of malignancy. A result letter of this
screening mammogram will be mailed directly to the patient.

RECOMMENDATION:
Screening mammogram in one year. (Code:[OD])

BI-RADS CATEGORY  1: Negative.

## 2020-10-04 ENCOUNTER — Encounter: Payer: Self-pay | Admitting: *Deleted

## 2020-10-07 ENCOUNTER — Ambulatory Visit: Payer: BC Managed Care – PPO | Admitting: Certified Registered Nurse Anesthetist

## 2020-10-07 ENCOUNTER — Encounter
Admission: RE | Disposition: A | Discharge status: Home / Self Care | Payer: Self-pay | Source: Home / Self Care | Attending: Gastroenterology

## 2020-10-07 ENCOUNTER — Ambulatory Visit
Admission: RE | Admit: 2020-10-07 | Discharge: 2020-10-07 | Disposition: A | Discharge status: Home / Self Care | Payer: BC Managed Care – PPO | Attending: Gastroenterology | Admitting: Gastroenterology

## 2020-10-07 DIAGNOSIS — K5730 Diverticulosis of large intestine without perforation or abscess without bleeding: Secondary | ICD-10-CM | POA: Insufficient documentation

## 2020-10-07 DIAGNOSIS — E669 Obesity, unspecified: Secondary | ICD-10-CM | POA: Insufficient documentation

## 2020-10-07 DIAGNOSIS — Z79899 Other long term (current) drug therapy: Secondary | ICD-10-CM | POA: Insufficient documentation

## 2020-10-07 DIAGNOSIS — Z6830 Body mass index (BMI) 30.0-30.9, adult: Secondary | ICD-10-CM | POA: Diagnosis not present

## 2020-10-07 DIAGNOSIS — I10 Essential (primary) hypertension: Secondary | ICD-10-CM | POA: Diagnosis not present

## 2020-10-07 DIAGNOSIS — Z1211 Encounter for screening for malignant neoplasm of colon: Secondary | ICD-10-CM | POA: Diagnosis not present

## 2020-10-07 DIAGNOSIS — Z7952 Long term (current) use of systemic steroids: Secondary | ICD-10-CM | POA: Diagnosis not present

## 2020-10-07 HISTORY — DX: Hypercalcemia: E83.52

## 2020-10-07 HISTORY — DX: Benign neoplasm of connective and other soft tissue, unspecified: D21.9

## 2020-10-07 HISTORY — DX: Age-related osteoporosis without current pathological fracture: M81.0

## 2020-10-07 HISTORY — PX: COLONOSCOPY WITH PROPOFOL: SHX5780

## 2020-10-07 SURGERY — COLONOSCOPY WITH PROPOFOL
Anesthesia: General

## 2020-10-07 MED ORDER — PROPOFOL 500 MG/50ML IV EMUL
INTRAVENOUS | Status: DC | PRN
  Administered 2020-10-07: 125 ug/kg/min via INTRAVENOUS

## 2020-10-07 MED ORDER — PROPOFOL 10 MG/ML IV BOLUS
INTRAVENOUS | Status: DC | PRN
  Administered 2020-10-07 (×2): 20 mg via INTRAVENOUS
  Administered 2020-10-07: 40 mg via INTRAVENOUS
  Administered 2020-10-07: 20 mg via INTRAVENOUS

## 2020-10-07 MED ORDER — PHENYLEPHRINE HCL (PRESSORS) 10 MG/ML IV SOLN
INTRAVENOUS | Status: AC
  Filled 2020-10-07: qty 1

## 2020-10-07 MED ORDER — PROPOFOL 500 MG/50ML IV EMUL
INTRAVENOUS | Status: AC
  Filled 2020-10-07: qty 50

## 2020-10-07 MED ORDER — PROPOFOL 10 MG/ML IV BOLUS
INTRAVENOUS | Status: AC
  Filled 2020-10-07: qty 20

## 2020-10-07 MED ORDER — SODIUM CHLORIDE 0.9 % IV SOLN
INTRAVENOUS | Status: DC
  Administered 2020-10-07: 20 mL/h via INTRAVENOUS

## 2020-10-07 MED ORDER — LIDOCAINE HCL (CARDIAC) PF 100 MG/5ML IV SOSY
PREFILLED_SYRINGE | INTRAVENOUS | Status: DC | PRN
  Administered 2020-10-07: 50 mg via INTRAVENOUS

## 2020-10-07 NOTE — Anesthesia Postprocedure Evaluation (Signed)
Anesthesia Post Note  Patient: Wanda Thomas  Procedure(s) Performed: COLONOSCOPY WITH PROPOFOL  Patient location during evaluation: PACU Anesthesia Type: General Level of consciousness: awake and alert Pain management: pain level controlled Vital Signs Assessment: post-procedure vital signs reviewed and stable Respiratory status: spontaneous breathing, nonlabored ventilation and respiratory function stable Cardiovascular status: blood pressure returned to baseline and stable Postop Assessment: no apparent nausea or vomiting Anesthetic complications: no   No notable events documented.   Last Vitals:  Vitals:   10/07/20 0654 10/07/20 0808  BP: (!) 158/84 115/70  Pulse: 75   Resp: 20   Temp: (!) 35.6 C (!) 35.9 C  SpO2: 98%     Last Pain:  Vitals:   10/07/20 0838  TempSrc:   PainSc: 0-No pain                 [place] Ouch

## 2020-10-07 NOTE — Anesthesia Preprocedure Evaluation (Signed)
Anesthesia Evaluation  Patient identified by MRN, date of birth, ID band Patient awake    Reviewed: Allergy & Precautions, NPO status , Patient's Chart, lab work & pertinent test results  Airway Mallampati: I  TM Distance: >3 FB Neck ROM: Full    Dental no notable dental hx.    Pulmonary neg pulmonary ROS,    Pulmonary exam normal breath sounds clear to auscultation       Cardiovascular Exercise Tolerance: Good hypertension, Normal cardiovascular exam Rhythm:Regular Rate:Normal     Neuro/Psych negative neurological ROS  negative psych ROS   GI/Hepatic Neg liver ROS, Bowel prep,  Endo/Other  negative endocrine ROS  Renal/GU negative Renal ROS  negative genitourinary   Musculoskeletal negative musculoskeletal ROS (+)   Abdominal (+) + obese,   Peds  Hematology negative hematology ROS (+)   Anesthesia Other Findings   Reproductive/Obstetrics negative OB ROS                             Anesthesia Physical Anesthesia Plan  ASA: 2  Anesthesia Plan: General   Post-op Pain Management:    Induction: Intravenous  PONV Risk Score and Plan: Propofol infusion and Treatment may vary due to age or medical condition  Airway Management Planned: Natural Airway and Nasal Cannula  Additional Equipment:   Intra-op Plan:   Post-operative Plan:   Informed Consent: I have reviewed the patients History and Physical, chart, labs and discussed the procedure including the risks, benefits and alternatives for the proposed anesthesia with the patient or authorized representative who has indicated his/her understanding and acceptance.     Dental Advisory Given  Plan Discussed with: Anesthesiologist, CRNA and Surgeon  Anesthesia Plan Comments:         Anesthesia Quick Evaluation

## 2020-10-07 NOTE — Interval H&P Note (Signed)
History and Physical Interval Note:  10/07/2020 7:44 AM  Wanda Thomas  has presented today for surgery, with the diagnosis of CCA SCREEN.  The various methods of treatment have been discussed with the patient and family. After consideration of risks, benefits and other options for treatment, the patient has consented to  Procedure(s): COLONOSCOPY WITH PROPOFOL (N/A) as a surgical intervention.  The patient's history has been reviewed, patient examined, no change in status, stable for surgery.  I have reviewed the patient's chart and labs.  Questions were answered to the patient's satisfaction.     [name]  Ok to proceed with colonoscopy

## 2020-10-07 NOTE — Transfer of Care (Signed)
Immediate Anesthesia Transfer of Care Note  Patient: Wanda Thomas  Procedure(s) Performed: COLONOSCOPY WITH PROPOFOL  Patient Location: PACU  Anesthesia Type:General  Level of Consciousness: awake, alert  and oriented  Airway & Oxygen Therapy: Patient Spontanous Breathing and Patient connected to nasal cannula oxygen  Post-op Assessment: Report given to RN and Post -op Vital signs reviewed and stable  Post vital signs: Reviewed and stable  Last Vitals:  Vitals Value Taken Time  BP 115/70 10/07/20 0809  Temp 35.9 C 10/07/20 0808  Pulse 71 10/07/20 0809  Resp 22 10/07/20 0809  SpO2 100 % 10/07/20 0809  Vitals shown include unvalidated device data.  Last Pain:  Vitals:   10/07/20 0808  TempSrc: Temporal  PainSc: Asleep         Complications: No notable events documented.

## 2020-10-07 NOTE — Op Note (Signed)
[facility] Gastroenterology Patient Name: Wanda Thomas Procedure Date: 10/07/2020 7:44 AM MRN: [id number] Account #: [id number] Date of Birth: 01-03-1956 Admit Type: Outpatient Age: 65 Room: [facility] ENDO ROOM 3 Gender: Female Note Status: Finalized Procedure:             Colonoscopy Indications:           Screening for colorectal malignant neoplasm Providers:             Andrey Farmer MD, MD Medicines:             Monitored Anesthesia Care Complications:         No immediate complications. Procedure:             Pre-Anesthesia Assessment:                        - Prior to the procedure, a History and Physical was                         performed, and patient medications and allergies were                         reviewed. The patient is competent. The risks and                         benefits of the procedure and the sedation options and                         risks were discussed with the patient. All questions                         were answered and informed consent was obtained.                         Patient identification and proposed procedure were                         verified by the physician, the nurse, the anesthetist                         and the technician in the endoscopy suite. Mental                         Status Examination: alert and oriented. Airway                         Examination: normal oropharyngeal airway and neck                         mobility. Respiratory Examination: clear to                         auscultation. CV Examination: normal. Prophylactic                         Antibiotics: The patient does not require prophylactic                         antibiotics. Prior Anticoagulants: The patient has  taken no previous anticoagulant or antiplatelet                         agents. ASA Grade Assessment: II - A patient with mild                         systemic disease. After reviewing the risks and                          benefits, the patient was deemed in satisfactory                         condition to undergo the procedure. The anesthesia                         plan was to use monitored anesthesia care (MAC).                         Immediately prior to administration of medications,                         the patient was re-assessed for adequacy to receive                         sedatives. The heart rate, respiratory rate, oxygen                         saturations, blood pressure, adequacy of pulmonary                         ventilation, and response to care were monitored                         throughout the procedure. The physical status of the                         patient was re-assessed after the procedure.                        After obtaining informed consent, the colonoscope was                         passed under direct vision. Throughout the procedure,                         the patient's blood pressure, pulse, and oxygen                         saturations were monitored continuously. The                         Colonoscope was introduced through the anus and                         advanced to the the cecum, identified by appendiceal                         orifice and ileocecal valve. The colonoscopy was  performed without difficulty. The patient tolerated                         the procedure well. The quality of the bowel                         preparation was good. Findings:      The perianal and digital rectal examinations were normal.      A few small-mouthed diverticula were found in the sigmoid colon,       descending colon, transverse colon and ascending colon.      The exam was otherwise without abnormality on direct and retroflexion       views. Impression:            - Diverticulosis in the sigmoid colon, in the                         descending colon, in the transverse colon and in the                         ascending  colon.                        - The examination was otherwise normal on direct and                         retroflexion views.                        - No specimens collected. Recommendation:        - Discharge patient to home.                        - Resume previous diet.                        - Continue present medications.                        - Repeat colonoscopy in 10 years for screening                         purposes.                        - Return to referring physician as previously                         scheduled. Procedure Code(s):     --- Professional ---                        R4854, Colorectal cancer screening; colonoscopy on                         individual not meeting criteria for high risk Diagnosis Code(s):     --- Professional ---                        Z12.11, Encounter for screening for malignant neoplasm  of colon                        K57.30, Diverticulosis of large intestine without                         perforation or abscess without bleeding CPT copyright 2019 [employer]. All rights reserved. The codes documented in this report are preliminary and upon coder review may  be revised to meet current compliance requirements. Andrey Farmer MD, MD 10/07/2020 8:09:04 AM Number of Addenda: 0 Note Initiated On: 10/07/2020 7:44 AM Scope Withdrawal Time: 0 hours 6 minutes 26 seconds  Total Procedure Duration: 0 hours 12 minutes 56 seconds  Estimated Blood Loss:  Estimated blood loss: none.      [facility]

## 2020-10-07 NOTE — H&P (Signed)
Outpatient short stay form Pre-procedure 10/07/2020 7:41 AM Raylene Miyamoto MD, MPH  Primary Physician: Dr. Carrie Mew  Reason for visit:  Screening  History of present illness:   65 y/o lady with history of hypertension here for screening colonoscopy. No family history of GI malignancies. History of c-sections. No blood thinners.    Current Facility-Administered Medications:    0.9 %  sodium chloride infusion, , Intravenous, Continuous, Khadeejah Castner, Hilton Cork, MD, Last Rate: 20 mL/hr at 10/07/20 0705, 20 mL/hr at 10/07/20 0705  Medications Prior to Admission  Medication Sig Dispense Refill Last Dose   atenolol (TENORMIN) 50 MG tablet Take 50 mg by mouth daily.   10/07/2020 at 0500   calcium carbonate (TUMS EX) 750 MG chewable tablet Chew 2 tablets by mouth every 2 (two) hours as needed for heartburn.   Past Week   clobetasol cream (TEMOVATE) [id number] % Apply 1 application topically 2 (two) times daily. 30 g 0 10/06/2020   famotidine (PEPCID) 10 MG tablet Take 10 mg by mouth daily as needed for heartburn or indigestion.   10/06/2020   hydrochlorothiazide (HYDRODIURIL) 25 MG tablet Take 25 mg by mouth daily.   10/07/2020 at 0500   methylPREDNISolone (MEDROL DOSEPAK) 4 MG TBPK tablet Take per package instructions 21 tablet 0 10/06/2020   phentermine 15 MG capsule Take 15 mg by mouth every morning.   10/06/2020   rosuvastatin (CRESTOR) 10 MG tablet Take 10 mg by mouth daily.   10/07/2020 at 0500   Vitamin D, Ergocalciferol, 50 MCG (2000 UT) CAPS Take by mouth.   10/06/2020   lovastatin (MEVACOR) 40 MG tablet Take 40 mg by mouth at bedtime.        Allergies  Allergen Reactions   Levofloxacin Itching     Past Medical History:  Diagnosis Date   Diverticulitis    Fibroid    Hypercalcemia    Hyperlipidemia    Hypertension    Osteoporosis     Review of systems:  Otherwise negative.    Physical Exam  Gen: Alert, oriented. Appears stated age.  HEENT: PERRLA. Lungs: No respiratory distress CV:  RRR Abd: soft, benign, no masses Ext: No edema    Planned procedures: Proceed with colonoscopy. The patient understands the nature of the planned procedure, indications, risks, alternatives and potential complications including but not limited to bleeding, infection, perforation, damage to internal organs and possible oversedation/side effects from anesthesia. The patient agrees and gives consent to proceed.  Please refer to procedure notes for findings, recommendations and patient disposition/instructions.     Raylene Miyamoto MD, MPH Gastroenterology 10/07/2020  7:41 AM

## 2020-10-08 ENCOUNTER — Encounter: Payer: Self-pay | Admitting: Gastroenterology

## 2020-12-12 ENCOUNTER — Other Ambulatory Visit: Payer: Self-pay | Admitting: Family Medicine

## 2020-12-12 DIAGNOSIS — R911 Solitary pulmonary nodule: Secondary | ICD-10-CM

## 2020-12-26 ENCOUNTER — Ambulatory Visit: Payer: BC Managed Care – PPO

## 2021-03-12 ENCOUNTER — Other Ambulatory Visit: Payer: Self-pay

## 2021-03-12 ENCOUNTER — Ambulatory Visit
Admission: RE | Admit: 2021-03-12 | Discharge: 2021-03-12 | Disposition: A | Discharge status: Home / Self Care | Payer: Medicare Other | Source: Ambulatory Visit | Attending: Family Medicine | Admitting: Family Medicine

## 2021-03-12 DIAGNOSIS — R911 Solitary pulmonary nodule: Secondary | ICD-10-CM | POA: Diagnosis present

## 2021-03-12 IMAGING — CT CT CHEST W/O CM
1 series · 15 of 34 positions shown, 19 images · non-contrast
Comparison: Chest CT [DATE]

CLINICAL DATA: Lung nodule.  Chest pain.

EXAM:
CT CHEST WITHOUT CONTRAST
TECHNIQUE: Multidetector CT imaging of the chest was performed following the
standard protocol without IV contrast.

[Series 2: thorax · axial · 0.63mm/px · z∈[-580,-336]mm · 15 of 144 slices shown, 19 images]
[im 11/144  mediastinal]
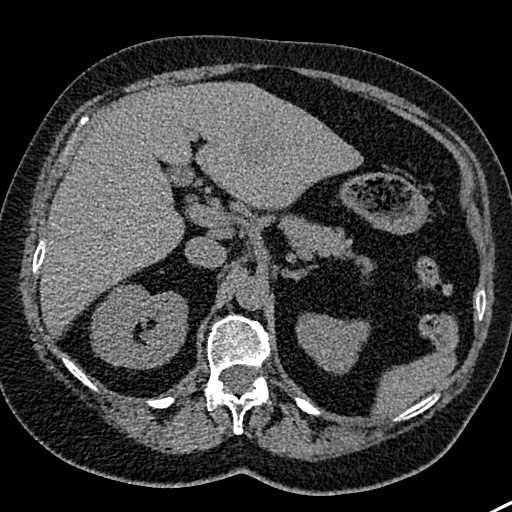
[im 11/144  lung]
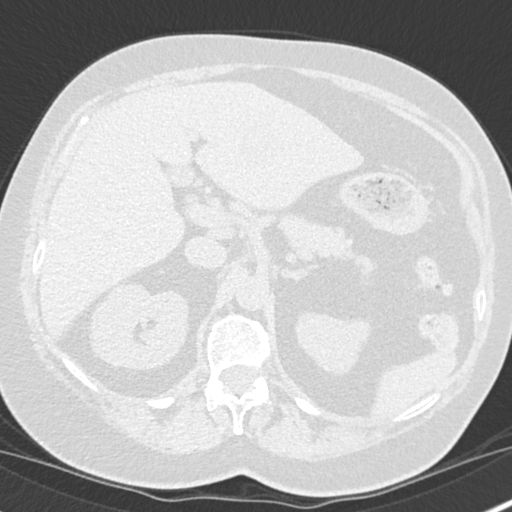
[im 22/144  lung]
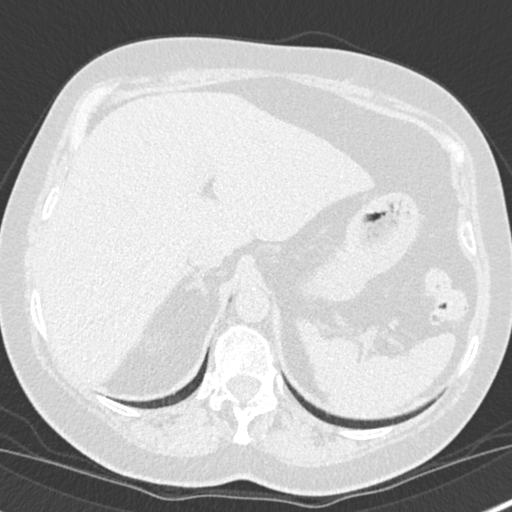
[im 29/144  lung]
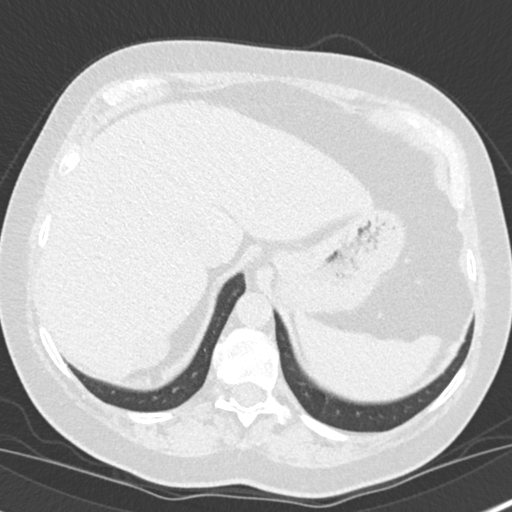
[im 38/144  lung]
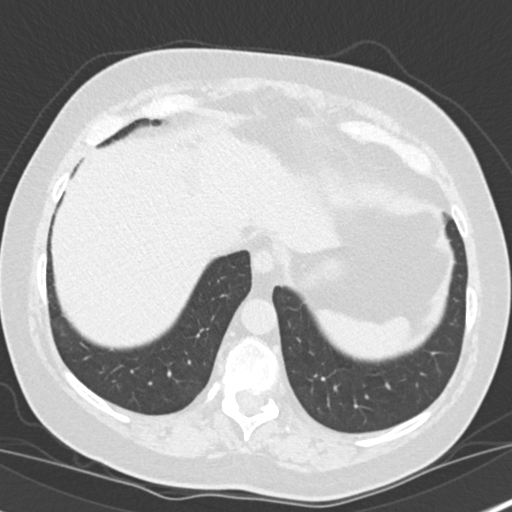
[im 48/144  mediastinal]
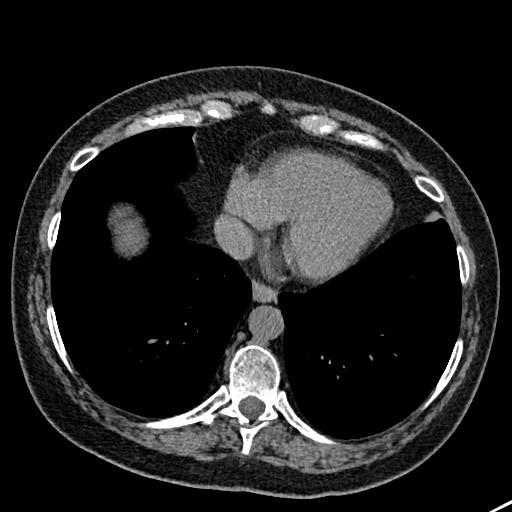
[im 48/144  lung]
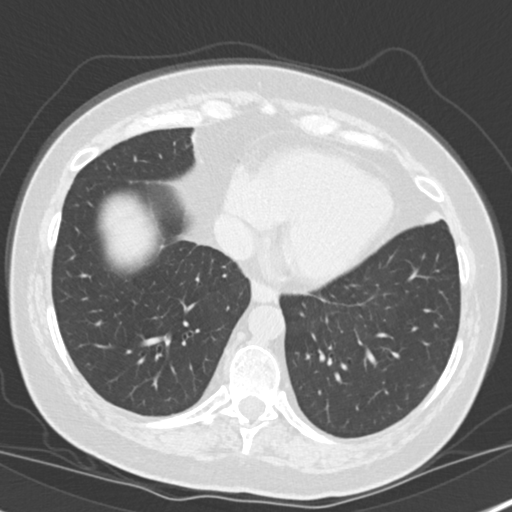
[im 58/144  lung]
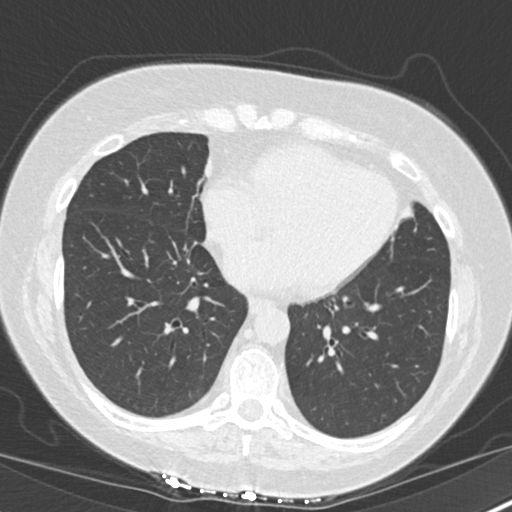
[im 64/144  lung]
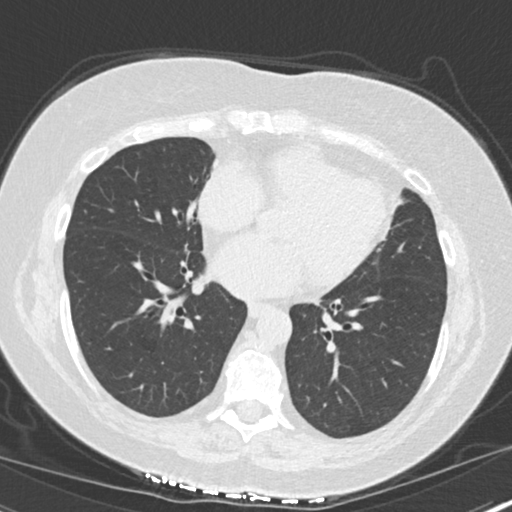
[im 75/144  lung]
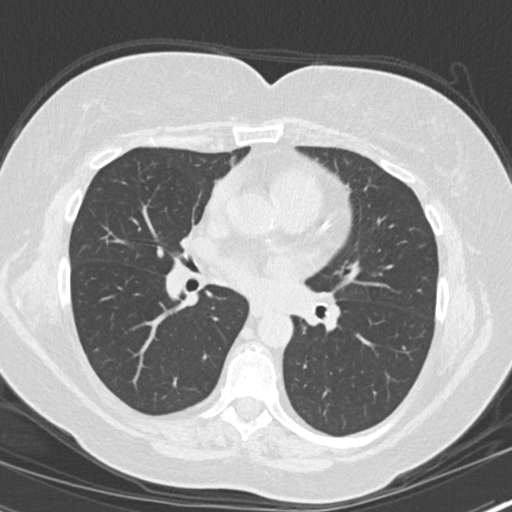
[im 80/144  mediastinal]
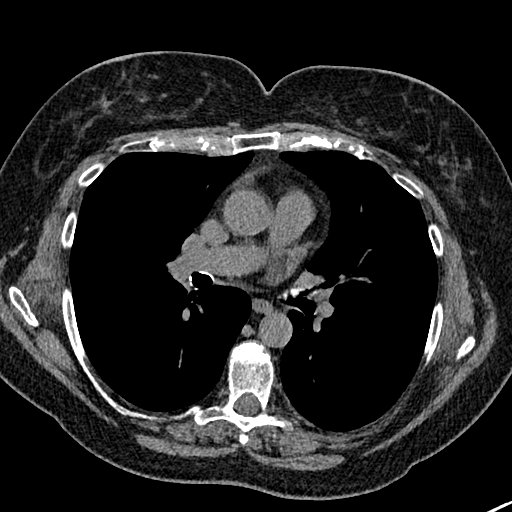
[im 80/144  lung]
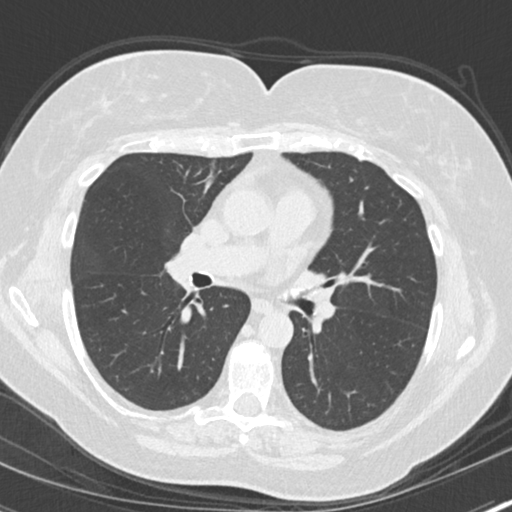
[im 86/144  lung]
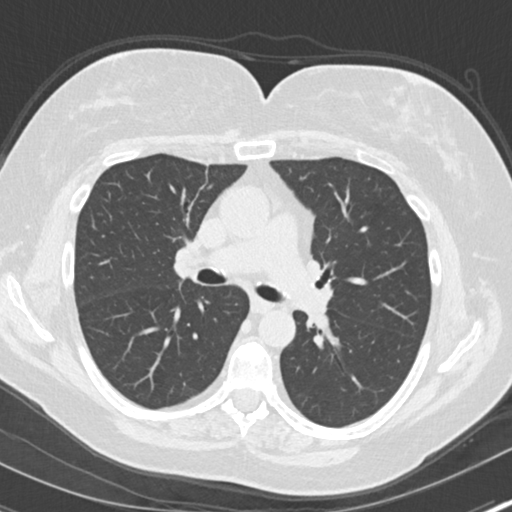
[im 96/144  lung]
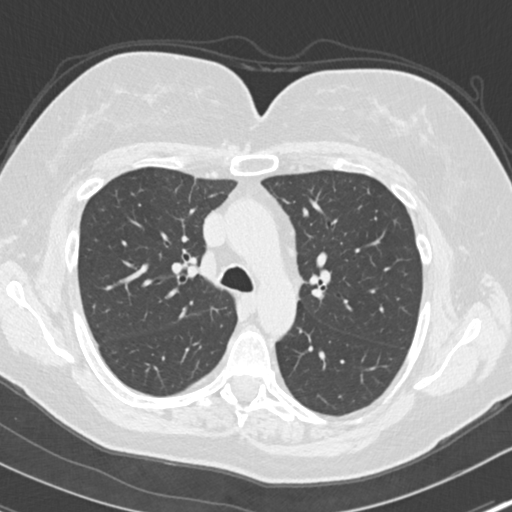
[im 106/144  lung]
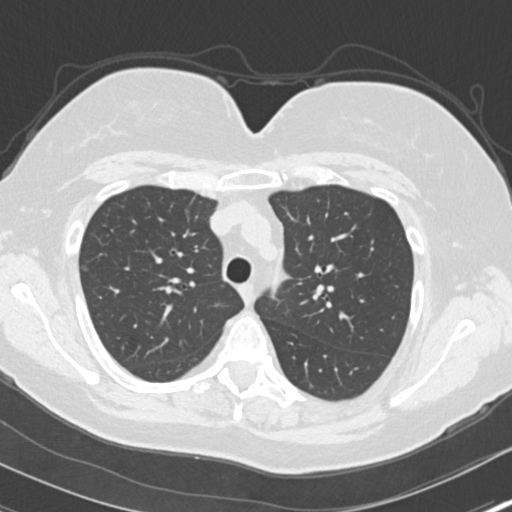
[im 115/144  mediastinal]
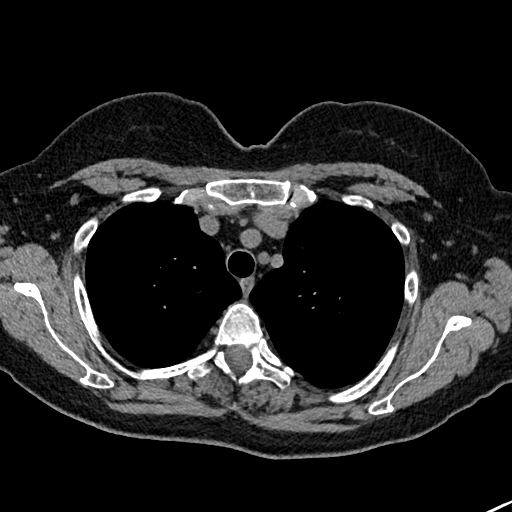
[im 115/144  lung]
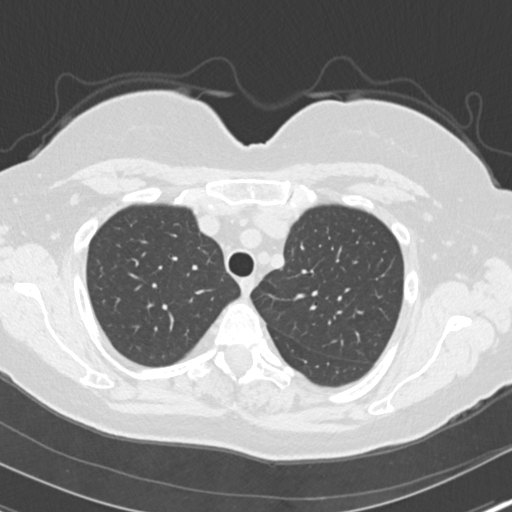
[im 122/144  lung]
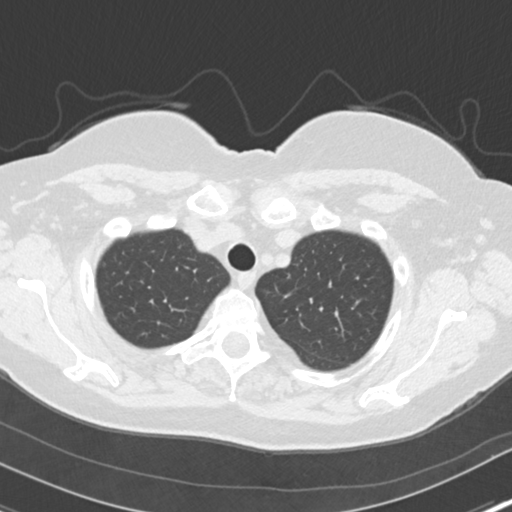
[im 133/144  lung]
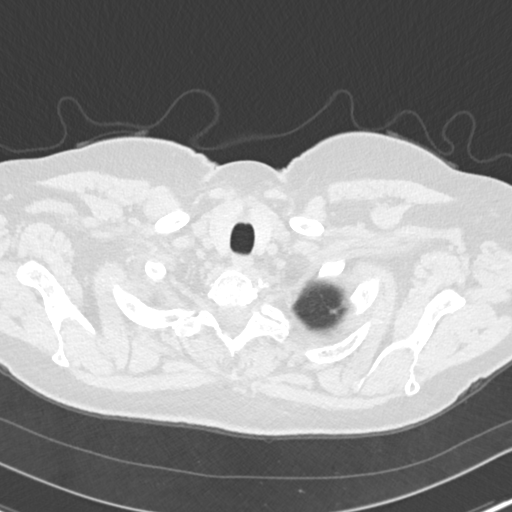

[15 of 34 positions shown; findings below may reference images not displayed]

FINDINGS: Cardiovascular: Mild thoracic aortic atherosclerosis without
aneurysm. LAD coronary atherosclerosis. Normal heart size. No
pericardial effusion.

Mediastinum/Nodes: No enlarged axillary, mediastinal, or hilar lymph
nodes are identified within limitations of noncontrast technique.
Unremarkable esophagus and thyroid.

Lungs/Pleura: No pleural effusion or pneumothorax. Unchanged 3 mm
nodule in the right middle lobe (series 3, image 85) and 3 mm nodule
in the left lower lobe (series 3, image 96). Mild scarring in the
medial right upper lobe, right middle lobe, and lingula.

Upper Abdomen: Unremarkable.

Musculoskeletal: Mild thoracic levoscoliosis. Unchanged sclerotic
foci in the T8 and T12 vertebral bodies. Mild thoracic disc
degeneration.
IMPRESSION: 1. Unchanged small lung nodules, likely benign. No further routine
follow-up imaging is recommended.
2. No acute abnormality in the chest.
3. Aortic Atherosclerosis ([10]-[10]).

## 2021-06-17 ENCOUNTER — Other Ambulatory Visit: Payer: Self-pay | Admitting: Physician Assistant

## 2021-06-17 DIAGNOSIS — Z1231 Encounter for screening mammogram for malignant neoplasm of breast: Secondary | ICD-10-CM

## 2021-07-29 ENCOUNTER — Ambulatory Visit: Payer: BC Managed Care – PPO

## 2021-08-27 ENCOUNTER — Ambulatory Visit
Admission: RE | Admit: 2021-08-27 | Discharge: 2021-08-27 | Disposition: A | Discharge status: Home / Self Care | Payer: Medicare Other | Source: Ambulatory Visit | Attending: Physician Assistant | Admitting: Physician Assistant

## 2021-08-27 DIAGNOSIS — Z1231 Encounter for screening mammogram for malignant neoplasm of breast: Secondary | ICD-10-CM | POA: Diagnosis not present

## 2021-08-27 IMAGING — MG MM DIGITAL SCREENING BILAT W/ TOMO AND CAD
8 series · 8 of 24 positions shown · non-contrast
Comparison: Previous exam(s).

CLINICAL DATA: Screening.

EXAM:
DIGITAL SCREENING BILATERAL MAMMOGRAM WITH TOMOSYNTHESIS AND CAD
TECHNIQUE: Bilateral screening digital craniocaudal and mediolateral oblique
mammograms were obtained. Bilateral screening digital breast
tomosynthesis was performed. The images were evaluated with
computer-aided detection.

[R CC synth-2D]
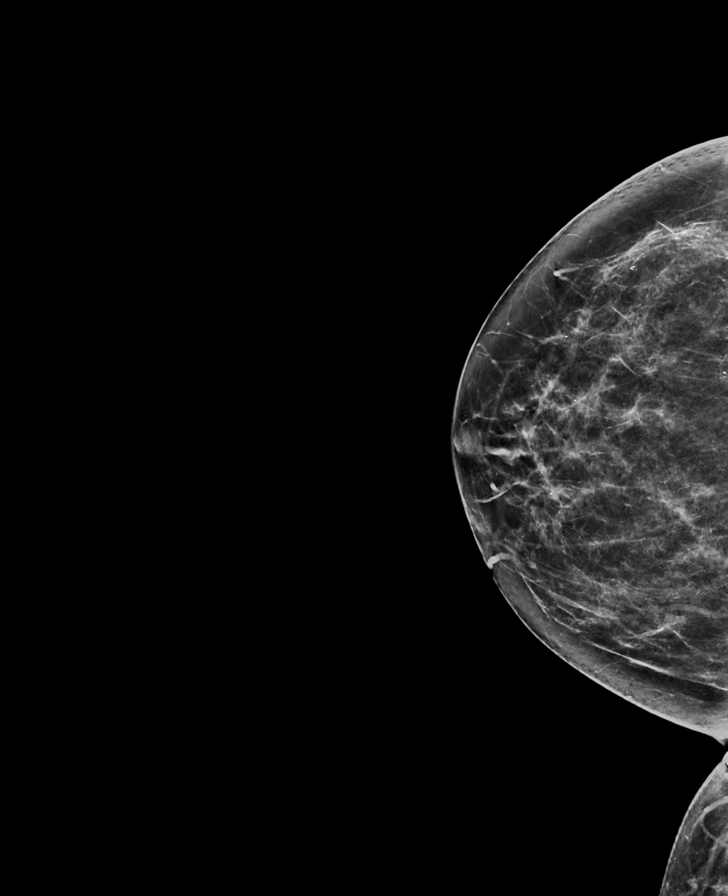

[L CC synth-2D]
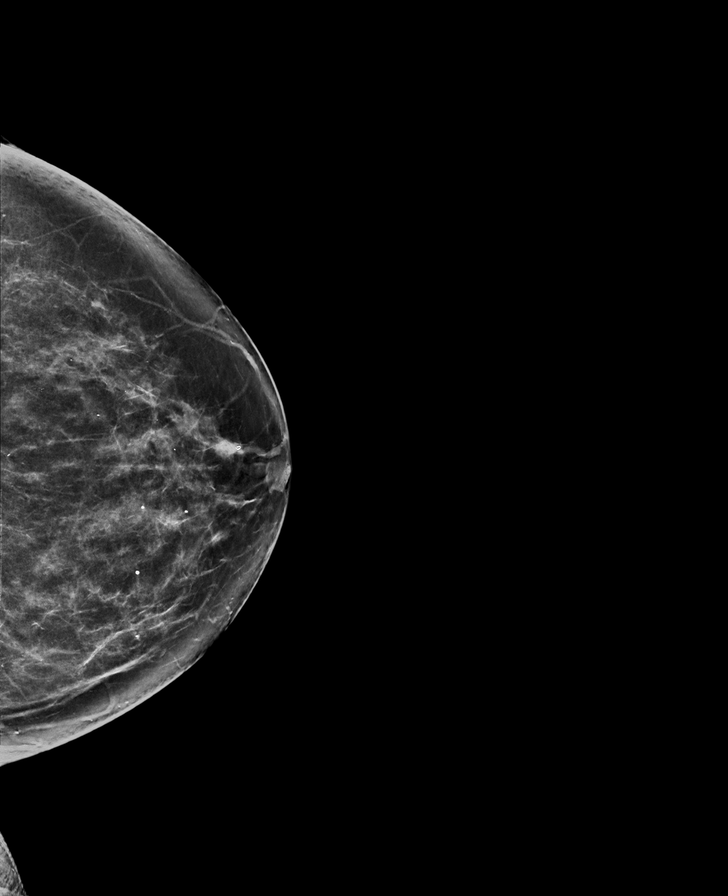

[L MLO synth-2D]
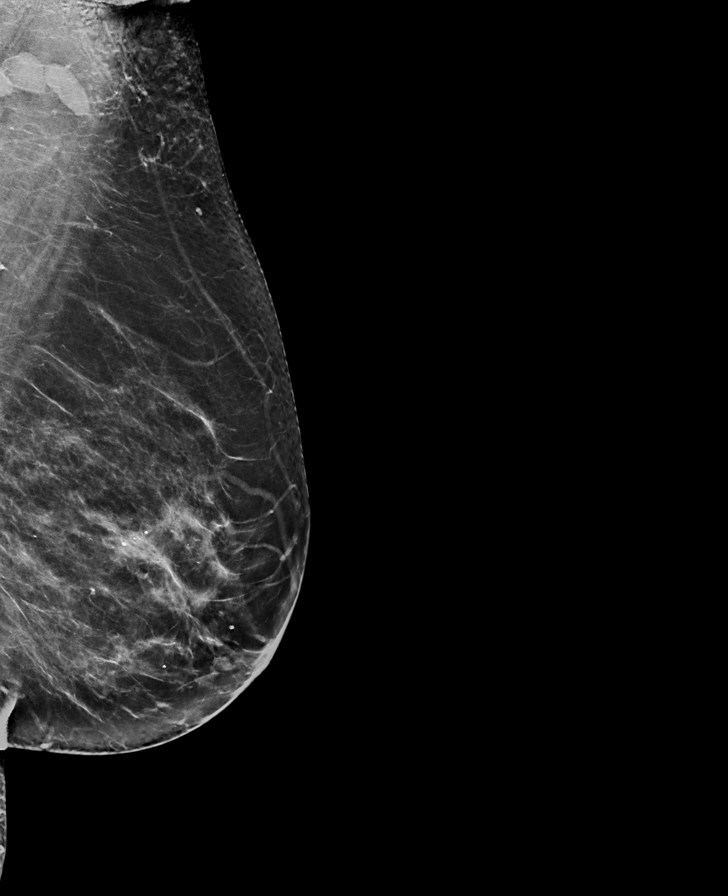

[R MLO synth-2D]
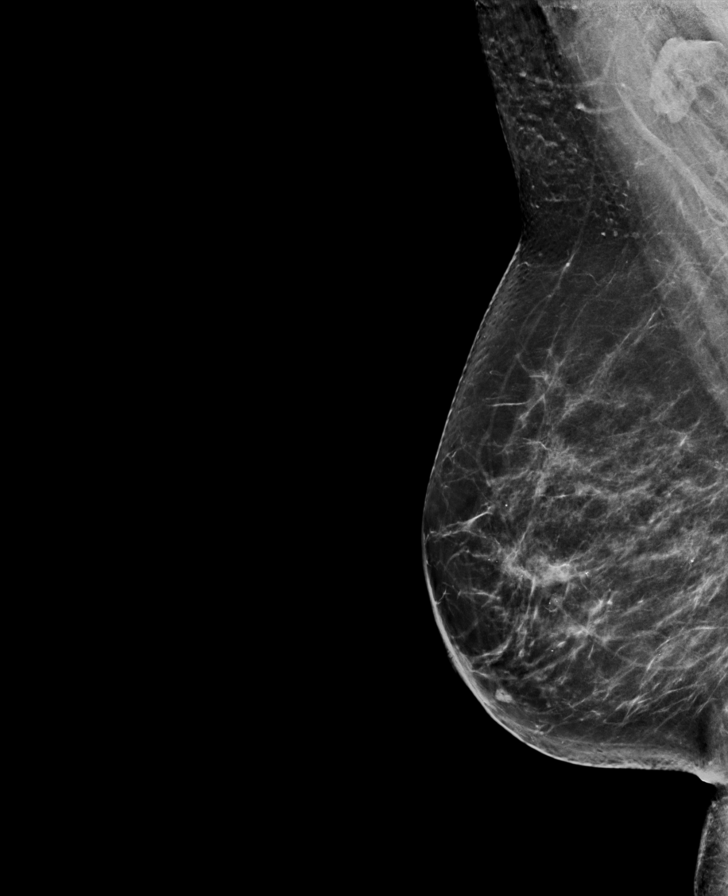

[L CC tomo · tomo slice 39/76.0]
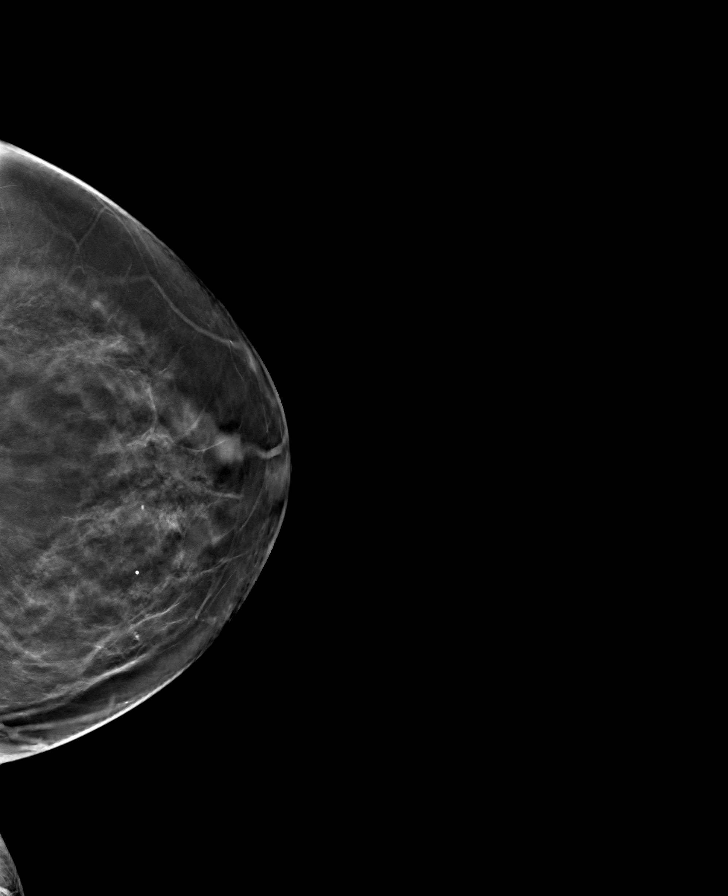

[L MLO tomo · tomo slice 39/77.0]
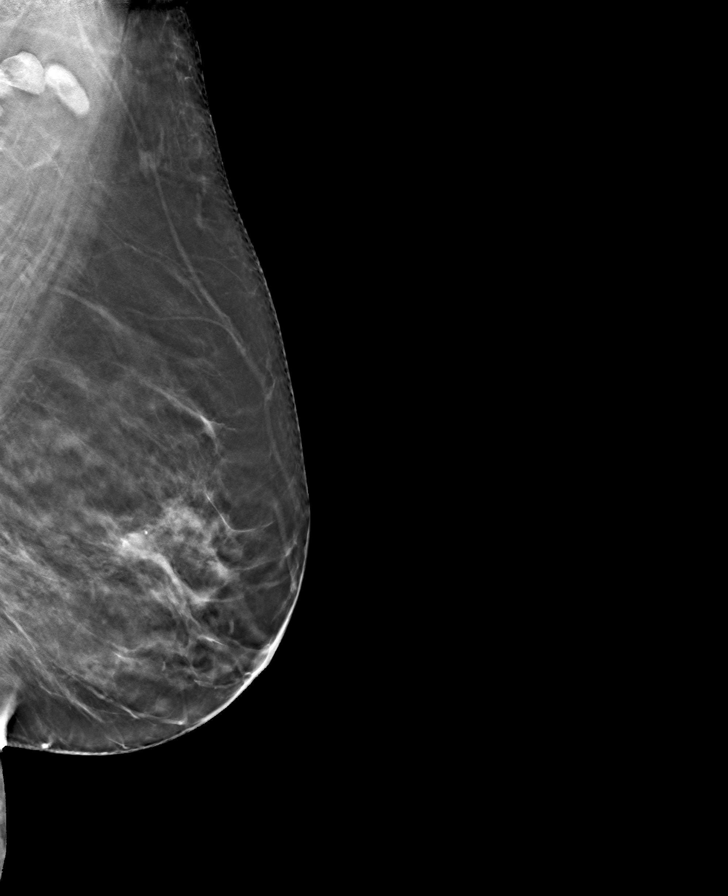

[R CC tomo · tomo slice 35/69.0]
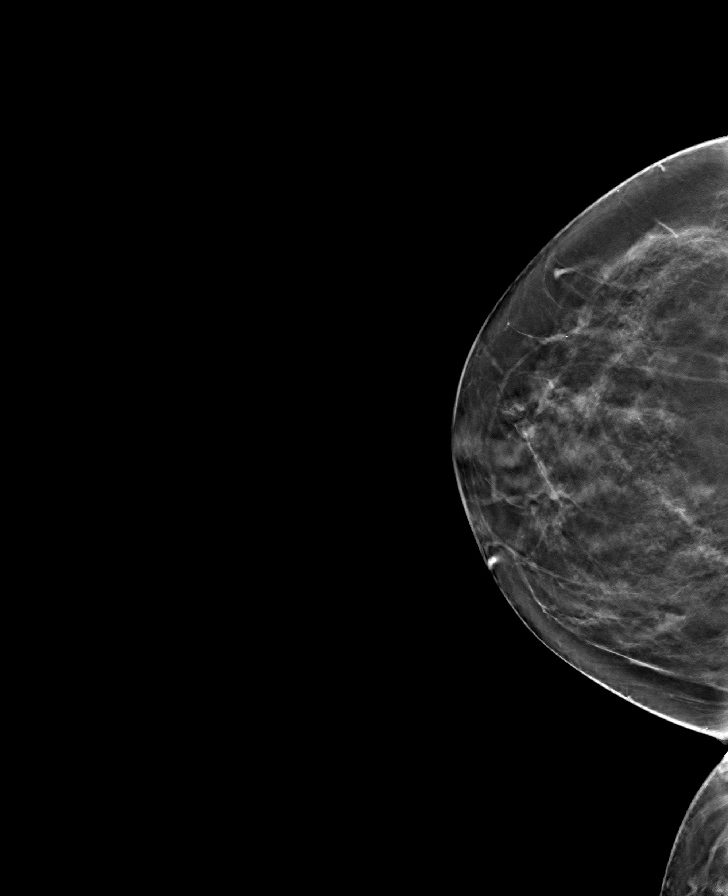

[R MLO tomo · tomo slice 39/76.0]
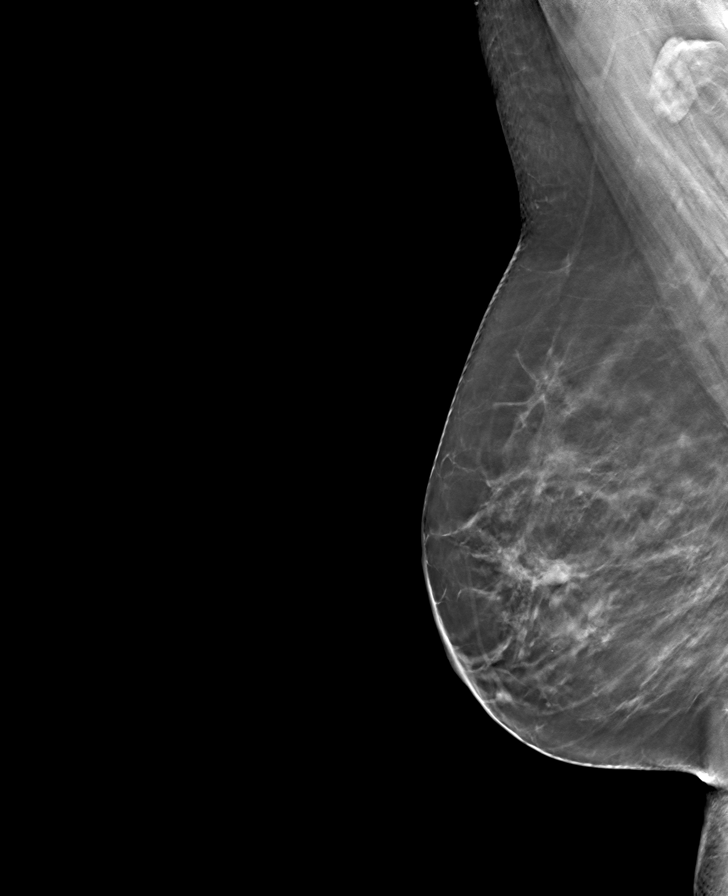

[8 of 24 positions shown; findings below may reference images not displayed]

ACR Breast Density Category c: The breast tissue is heterogeneously
dense, which may obscure small masses.
FINDINGS: There are no findings suspicious for malignancy.
IMPRESSION: No mammographic evidence of malignancy. A result letter of this
screening mammogram will be mailed directly to the patient.

RECOMMENDATION:
Screening mammogram in one year. (Code:[V2])

BI-RADS CATEGORY  1: Negative.

## 2022-04-01 ENCOUNTER — Ambulatory Visit: Payer: BC Managed Care – PPO

## 2022-04-10 ENCOUNTER — Ambulatory Visit (INDEPENDENT_AMBULATORY_CARE_PROVIDER_SITE_OTHER): Payer: BC Managed Care – PPO

## 2022-04-10 ENCOUNTER — Ambulatory Visit
Admission: RE | Admit: 2022-04-10 | Discharge: 2022-04-10 | Disposition: A | Discharge status: Home / Self Care | Payer: BC Managed Care – PPO | Source: Ambulatory Visit | Attending: Nurse Practitioner | Admitting: Nurse Practitioner

## 2022-04-10 VITALS — BP 161/88 | HR 78 | Temp 98.8°F | Resp 18 | Ht 61.0 in | Wt 162.9 lb

## 2022-04-10 DIAGNOSIS — J40 Bronchitis, not specified as acute or chronic: Secondary | ICD-10-CM | POA: Diagnosis not present

## 2022-04-10 DIAGNOSIS — R051 Acute cough: Secondary | ICD-10-CM | POA: Diagnosis not present

## 2022-04-10 DIAGNOSIS — H6993 Unspecified Eustachian tube disorder, bilateral: Secondary | ICD-10-CM | POA: Diagnosis not present

## 2022-04-10 MED ORDER — AMOXICILLIN-POT CLAVULANATE 875-125 MG PO TABS: 1.0000 | ORAL_TABLET | Freq: Two times a day (BID) | ORAL | 0 refills | Status: DC

## 2022-04-10 MED ORDER — PREDNISONE 20 MG PO TABS: 40.0000 mg | ORAL_TABLET | Freq: Every day | ORAL | 0 refills | Status: AC

## 2022-04-10 MED ORDER — BENZONATATE 200 MG PO CAPS: 200.0000 mg | ORAL_CAPSULE | Freq: Three times a day (TID) | ORAL | 0 refills | Status: AC | PRN

## 2022-04-10 NOTE — ED Provider Notes (Signed)
[facility]    CSN: [id number] Arrival date & time: 04/10/22  1046      History   Chief Complaint Chief Complaint  Patient presents with   Cough    HPI Wanda Thomas is a 67 y.o. female who presents for evaluation of URI symptoms for 1.5 months. Patient reports associated symptoms of dry cough with bilateral ear fullness. Pt states she was treated for flu around christmas with tamilfu. She continues to have a dry cough since then that is persistent and is somewhat improving, denies worsening cough. Had fevers initially but none in the past few weeks. Denies N/V/D, ST, body aches, shortness of breath.  Does feel like she has a more difficult time taking a deep breath.  Patient does not have a hx of asthma or smoking. No known sick contacts and no recent travel. Pt is vaccinated for COVID. Pt is vaccinated for flu this season. Pt has taken Mucinex OTC for symptoms. Pt has no other concerns at this time.    Cough Associated symptoms: ear pain     Past Medical History:  Diagnosis Date   Diverticulitis    Fibroid    Hypercalcemia    Hyperlipidemia    Hypertension    Osteoporosis     There are no problems to display for this patient.   Past Surgical History:  Procedure Laterality Date   ANKLE SURGERY     c section     COLONOSCOPY WITH PROPOFOL N/A 10/07/2020   Procedure: COLONOSCOPY WITH PROPOFOL;  Surgeon: [name], MD;  Location: [facility] ENDOSCOPY;  Service: Endoscopy;  Laterality: N/A;    OB History   No obstetric history on file.      Home Medications    Prior to Admission medications   Medication Sig Start Date End Date Taking? Authorizing Provider  amoxicillin-clavulanate (AUGMENTIN) 875-125 MG tablet Take 1 tablet by mouth every 12 (twelve) hours. 04/10/22  Yes Melynda Ripple, NP  atenolol (TENORMIN) 50 MG tablet Take 50 mg by mouth daily.   Yes [provider]  benzonatate (TESSALON) 200 MG capsule Take 1 capsule (200 mg total)  by mouth 3 (three) times daily as needed for cough. 04/10/22  Yes Melynda Ripple, NP  calcium carbonate (TUMS EX) 750 MG chewable tablet Chew 2 tablets by mouth every 2 (two) hours as needed for heartburn.   Yes [provider]  famotidine (PEPCID) 10 MG tablet Take 10 mg by mouth daily as needed for heartburn or indigestion.   Yes [provider]  hydrochlorothiazide (HYDRODIURIL) 25 MG tablet Take 25 mg by mouth daily.   Yes [provider]  predniSONE (DELTASONE) 20 MG tablet Take 2 tablets (40 mg total) by mouth daily with breakfast for 5 days. 04/10/22 04/15/22 Yes Melynda Ripple, NP  rosuvastatin (CRESTOR) 10 MG tablet Take 10 mg by mouth daily.   Yes [provider]  Vitamin D, Ergocalciferol, 50 MCG (2000 UT) CAPS Take by mouth.   Yes [provider]  clobetasol cream (TEMOVATE) 3.57 % Apply 1 application topically 2 (two) times daily. 10/23/16   Lorin Picket, PA-C  lovastatin (MEVACOR) 40 MG tablet Take 40 mg by mouth at bedtime.    [provider]  phentermine 15 MG capsule Take 15 mg by mouth every morning.    [provider]    Family History Family History  Problem Relation Age of Onset   Breast cancer Neg Hx  Social History Social History   Tobacco Use   Smoking status: Never   Smokeless tobacco: Never  Vaping Use   Vaping Use: Never used  Substance Use Topics   Alcohol use: Yes    Alcohol/week: 2.0 standard drinks of alcohol    Types: 2 Glasses of wine per week    Comment: wine daily   Drug use: No     Allergies   Levofloxacin   Review of Systems Review of Systems  HENT:  Positive for ear pain.   Respiratory:  Positive for cough.      Physical Exam Triage Vital Signs ED Triage Vitals  Enc Vitals Group     BP 04/10/22 1102 (!) 161/88     Pulse Rate 04/10/22 1102 78     Resp 04/10/22 1102 18     Temp 04/10/22 1102 98.8 F (37.1 C)     Temp Source 04/10/22 1102 Oral     SpO2 04/10/22 1102  98 %     Weight 04/10/22 1100 162 lb 14.7 oz (73.9 kg)     Height 04/10/22 1100 '5\' 1"'$  (1.549 m)     Head Circumference --      Peak Flow --      Pain Score 04/10/22 1059 3     Pain Loc --      Pain Edu? --      Excl. in [place]? --    No data found.  Updated Vital Signs BP (!) 161/88 (BP Location: Left Arm)   Pulse 78   Temp 98.8 F (37.1 C) (Oral)   Resp 18   Ht '5\' 1"'$  (1.549 m)   Wt 162 lb 14.7 oz (73.9 kg)   SpO2 98%   BMI 30.78 kg/m   Visual Acuity Right Eye Distance:   Left Eye Distance:   Bilateral Distance:    Right Eye Near:   Left Eye Near:    Bilateral Near:     Physical Exam Vitals and nursing note reviewed.  Constitutional:      General: She is not in acute distress.    Appearance: She is well-developed. She is not ill-appearing.  HENT:     Head: Normocephalic and atraumatic.     Right Ear: Ear canal normal. A middle ear effusion is present. Tympanic membrane is not erythematous.     Left Ear: Ear canal normal. A middle ear effusion is present. Tympanic membrane is not erythematous.     Nose: No congestion or rhinorrhea.     Mouth/Throat:     Mouth: Mucous membranes are moist.     Pharynx: Oropharynx is clear. Uvula midline. No oropharyngeal exudate or posterior oropharyngeal erythema.     Tonsils: No tonsillar exudate or tonsillar abscesses.  Eyes:     Conjunctiva/sclera: Conjunctivae normal.     Pupils: Pupils are equal, round, and reactive to light.  Cardiovascular:     Rate and Rhythm: Normal rate and regular rhythm.     Heart sounds: Normal heart sounds.  Pulmonary:     Effort: Pulmonary effort is normal.     Breath sounds: Normal breath sounds.  Musculoskeletal:     Cervical back: Normal range of motion and neck supple.  Lymphadenopathy:     Cervical: No cervical adenopathy.  Skin:    General: Skin is warm and dry.  Neurological:     General: No focal deficit present.     Mental Status: She is alert and oriented to person, place, and time.   Psychiatric:  Mood and Affect: Mood normal.        Behavior: Behavior normal.      UC Treatments / Results  Labs (all labs ordered are listed, but only abnormal results are displayed) Labs Reviewed - No data to display  EKG   Radiology DG Chest 2 View  Result Date: 04/10/2022 CLINICAL DATA:  Provided history: Cough. Chest tightness. Shortness of breath. EXAM: CHEST - 2 VIEW COMPARISON:  Chest CT 03/12/2021. Report from chest radiographs 04/14/2019 (images unavailable). FINDINGS: Heart size within normal limits. No appreciable airspace consolidation. No evidence of pleural effusion or pneumothorax. No acute bony abnormality identified. Levocurvature of the thoracic spine. IMPRESSION: No evidence of acute cardiopulmonary abnormality. Electronically Signed   By: Kellie Simmering D.O.   On: 04/10/2022 11:41    Procedures Procedures (including critical care time)  Medications Ordered in UC Medications - No data to display  Initial Impression / Assessment and Plan / UC Course  I have reviewed the triage vital signs and the nursing notes.  Pertinent labs & imaging results that were available during my care of the patient were reviewed by me and considered in my medical decision making (see chart for details).     Reviewed exam and symptoms with him.  No red flags on exam. Chest x-ray negative for pneumonia given length of symptoms will start Augmentin 875 mg twice daily for 7 days Tessalon 200 mg as needed cough Prednisone 40 mg daily for 5 days Rest and fluids PCP follow-up 2 to 3 days for recheck ER precautions reviewed and patient verbalized understanding Final Clinical Impressions(s) / UC Diagnoses   Final diagnoses:  Acute cough  Eustachian tube dysfunction, bilateral  Bronchitis     Discharge Instructions      Augmentin twice daily for 7 days Prednisone daily for 5 days Tessalon as needed for cough Rest and fluids Follow-up with your PCP in 2 to 3 days for  recheck Please go to the emergency room if you have any worsening symptoms     ED Prescriptions     Medication Sig Dispense Auth. Provider   benzonatate (TESSALON) 200 MG capsule Take 1 capsule (200 mg total) by mouth 3 (three) times daily as needed for cough. 20 capsule Melynda Ripple, NP   predniSONE (DELTASONE) 20 MG tablet Take 2 tablets (40 mg total) by mouth daily with breakfast for 5 days. 10 tablet Melynda Ripple, NP   amoxicillin-clavulanate (AUGMENTIN) 875-125 MG tablet Take 1 tablet by mouth every 12 (twelve) hours. 14 tablet Melynda Ripple, NP      PDMP not reviewed this encounter.   Melynda Ripple, NP 04/10/22 1150

## 2022-04-10 NOTE — ED Triage Notes (Signed)
Pt c/o cough, chest tightness and shortness of breath. Started back at [place] when she had the flu. No knew fevers.

## 2022-04-10 NOTE — Discharge Instructions (Signed)
Augmentin twice daily for 7 days Prednisone daily for 5 days Tessalon as needed for cough Rest and fluids Follow-up with your PCP in 2 to 3 days for recheck Please go to the emergency room if you have any worsening symptoms

## 2022-09-03 ENCOUNTER — Other Ambulatory Visit: Payer: Self-pay

## 2022-09-03 DIAGNOSIS — Z1231 Encounter for screening mammogram for malignant neoplasm of breast: Secondary | ICD-10-CM

## 2022-09-23 ENCOUNTER — Ambulatory Visit
Admission: RE | Admit: 2022-09-23 | Discharge: 2022-09-23 | Disposition: A | Discharge status: Home / Self Care | Payer: Medicare Other | Source: Ambulatory Visit | Attending: Physician Assistant | Admitting: Physician Assistant

## 2022-09-23 DIAGNOSIS — Z1231 Encounter for screening mammogram for malignant neoplasm of breast: Secondary | ICD-10-CM | POA: Diagnosis present

## 2023-05-24 ENCOUNTER — Other Ambulatory Visit: Payer: Self-pay | Admitting: Physician Assistant

## 2023-05-24 DIAGNOSIS — R1011 Right upper quadrant pain: Secondary | ICD-10-CM

## 2023-05-25 ENCOUNTER — Ambulatory Visit
Admission: RE | Admit: 2023-05-25 | Discharge: 2023-05-25 | Disposition: A | Discharge status: Home / Self Care | Source: Ambulatory Visit | Attending: Physician Assistant | Admitting: Physician Assistant

## 2023-05-25 DIAGNOSIS — R1011 Right upper quadrant pain: Secondary | ICD-10-CM | POA: Diagnosis present

## 2023-07-06 ENCOUNTER — Other Ambulatory Visit: Payer: Self-pay | Admitting: Physician Assistant

## 2023-07-06 DIAGNOSIS — Z1231 Encounter for screening mammogram for malignant neoplasm of breast: Secondary | ICD-10-CM

## 2023-09-28 ENCOUNTER — Ambulatory Visit
Admission: RE | Admit: 2023-09-28 | Discharge: 2023-09-28 | Disposition: A | Discharge status: Home / Self Care | Source: Ambulatory Visit | Attending: Physician Assistant | Admitting: Physician Assistant

## 2023-09-28 DIAGNOSIS — Z1231 Encounter for screening mammogram for malignant neoplasm of breast: Secondary | ICD-10-CM | POA: Diagnosis present

## 2023-10-05 ENCOUNTER — Other Ambulatory Visit: Payer: Self-pay | Admitting: Physician Assistant

## 2023-10-05 DIAGNOSIS — R928 Other abnormal and inconclusive findings on diagnostic imaging of breast: Secondary | ICD-10-CM

## 2023-10-06 ENCOUNTER — Ambulatory Visit
Admission: RE | Admit: 2023-10-06 | Discharge: 2023-10-06 | Disposition: A | Discharge status: Home / Self Care | Source: Ambulatory Visit | Attending: Physician Assistant | Admitting: Physician Assistant

## 2023-10-06 DIAGNOSIS — R928 Other abnormal and inconclusive findings on diagnostic imaging of breast: Secondary | ICD-10-CM | POA: Diagnosis present

## 2023-10-07 ENCOUNTER — Encounter: Payer: Self-pay | Admitting: Internal Medicine

## 2023-10-07 ENCOUNTER — Other Ambulatory Visit: Payer: Self-pay | Admitting: Physician Assistant

## 2023-10-07 DIAGNOSIS — R928 Other abnormal and inconclusive findings on diagnostic imaging of breast: Secondary | ICD-10-CM

## 2023-10-11 ENCOUNTER — Encounter

## 2023-10-13 ENCOUNTER — Ambulatory Visit
Admission: RE | Admit: 2023-10-13 | Discharge: 2023-10-13 | Disposition: A | Discharge status: Home / Self Care | Source: Ambulatory Visit | Attending: Physician Assistant | Admitting: Physician Assistant

## 2023-10-13 DIAGNOSIS — R928 Other abnormal and inconclusive findings on diagnostic imaging of breast: Secondary | ICD-10-CM | POA: Diagnosis present

## 2023-10-13 HISTORY — PX: BREAST BIOPSY: SHX20

## 2023-10-13 MED ORDER — LIDOCAINE 1 % OPTIME INJ - NO CHARGE
5.0000 mL | Freq: Once | INTRAMUSCULAR | Status: AC
  Administered 2023-10-13: 5 mL
  Filled 2023-10-13: qty 6

## 2023-10-13 MED ORDER — LIDOCAINE-EPINEPHRINE 1 %-1:100000 IJ SOLN
20.0000 mL | Freq: Once | INTRAMUSCULAR | Status: AC
Start: 2023-10-13 — End: 2023-10-13
  Administered 2023-10-13: 20 mL
  Filled 2023-10-13: qty 20

## 2023-10-14 LAB — SURGICAL PATHOLOGY

## 2023-12-11 ENCOUNTER — Ambulatory Visit
Admission: EM | Admit: 2023-12-11 | Discharge: 2023-12-11 | Disposition: A | Discharge status: Home / Self Care | Attending: Student | Admitting: Student

## 2023-12-11 ENCOUNTER — Encounter: Payer: Self-pay | Admitting: Emergency Medicine

## 2023-12-11 DIAGNOSIS — H6123 Impacted cerumen, bilateral: Secondary | ICD-10-CM | POA: Diagnosis not present

## 2023-12-11 NOTE — Discharge Instructions (Addendum)
 Follow up as needed

## 2023-12-11 NOTE — ED Provider Notes (Signed)
[facility]    CSN: [id number] Arrival date & time: 12/11/23  1223      History   Chief Complaint Chief Complaint  Patient presents with   Otalgia    right    HPI Lakya Schrupp is a 68 y.o. female presenting with right ear fullness for about 12 hours.  She does have a prior history of impacted cerumen.  She used Debrox solution at home, which did not resolve the symptoms.  Denies recent URI, fever, cough, congestion, dizziness.  HPI  Past Medical History:  Diagnosis Date   Diverticulitis    Fibroid    Hypercalcemia    Hyperlipidemia    Hypertension    Osteoporosis     There are no active problems to display for this patient.   Past Surgical History:  Procedure Laterality Date   ANKLE SURGERY     BREAST BIOPSY Right 10/13/2023   MM RT BREAST BX W LOC DEV 1ST LESION IMAGE BX SPEC STEREO GUIDE 10/13/2023 [facility]   c section     COLONOSCOPY WITH PROPOFOL  N/A 10/07/2020   Procedure: COLONOSCOPY WITH PROPOFOL ;  Surgeon: Maryruth Ole DASEN, MD;  Location: [facility];  Service: Endoscopy;  Laterality: N/A;    OB History   No obstetric history on file.      Home Medications    Prior to Admission medications   Medication Sig Start Date End Date Taking? Authorizing Provider  atenolol (TENORMIN) 50 MG tablet Take 50 mg by mouth daily.    [provider]  benzonatate  (TESSALON ) 200 MG capsule Take 1 capsule (200 mg total) by mouth 3 (three) times daily as needed for cough. 04/10/22   Mayer, Jodi R, NP  calcium carbonate (TUMS EX) 750 MG chewable tablet Chew 2 tablets by mouth every 2 (two) hours as needed for heartburn.    [provider]  clobetasol  cream (TEMOVATE ) 0.05 % Apply 1 application topically 2 (two) times daily. 10/23/16   Lacinda Elsie SQUIBB, PA-C  famotidine (PEPCID) 10 MG tablet Take 10 mg by mouth daily as needed for heartburn or indigestion.    [provider]  hydrochlorothiazide (HYDRODIURIL) 25 MG tablet  Take 25 mg by mouth daily.    [provider]  lovastatin (MEVACOR) 40 MG tablet Take 40 mg by mouth at bedtime.    [provider]  phentermine 15 MG capsule Take 15 mg by mouth every morning.    [provider]  rosuvastatin (CRESTOR) 10 MG tablet Take 10 mg by mouth daily.    [provider]  Vitamin D, Ergocalciferol, 50 MCG (2000 UT) CAPS Take by mouth.    [provider]    Family History Family History  Problem Relation Age of Onset   Breast cancer Neg Hx     Social History Social History   Tobacco Use   Smoking status: Never   Smokeless tobacco: Never  Vaping Use   Vaping status: Never Used  Substance Use Topics   Alcohol use: Yes    Alcohol/week: 2.0 standard drinks of alcohol    Types: 2 Glasses of wine per week    Comment: wine daily   Drug use: No     Allergies   Levofloxacin   Review of Systems Review of Systems  HENT:         R ear discomfort     Physical Exam Triage Vital Signs ED Triage Vitals  Encounter Vitals Group     BP 12/11/23 1247 (!)  144/87     Girls Systolic BP Percentile --      Girls Diastolic BP Percentile --      Boys Systolic BP Percentile --      Boys Diastolic BP Percentile --      Pulse Rate 12/11/23 1247 66     Resp 12/11/23 1247 14     Temp 12/11/23 1247 98.3 F (36.8 C)     Temp Source 12/11/23 1247 Oral     SpO2 12/11/23 1247 96 %     Weight 12/11/23 1245 162 lb 14.7 oz (73.9 kg)     Height 12/11/23 1245 5' 1 (1.549 m)     Head Circumference --      Peak Flow --      Pain Score 12/11/23 1245 1     Pain Loc --      Pain Education --      Exclude from Growth Chart --    No data found.  Updated Vital Signs BP (!) 144/87 (BP Location: Right Arm)   Pulse 66   Temp 98.3 F (36.8 C) (Oral)   Resp 14   Ht 5' 1 (1.549 m)   Wt 162 lb 14.7 oz (73.9 kg)   SpO2 96%   BMI 30.78 kg/m   Visual Acuity Right Eye Distance:   Left Eye Distance:   Bilateral Distance:     Right Eye Near:   Left Eye Near:    Bilateral Near:     Physical Exam Vitals reviewed.  Constitutional:      General: She is not in acute distress.    Appearance: Normal appearance. She is not ill-appearing.  HENT:     Head: Normocephalic and atraumatic.     Ears:     Comments: Bilateral tympanic membranes were initially fully occluded by cerumen.  Following lavage, external auditory canals are erythematous, with no bleeding, and tympanic membranes are pearly gray and intact. Pulmonary:     Effort: Pulmonary effort is normal.  Neurological:     General: No focal deficit present.     Mental Status: She is alert and oriented to person, place, and time.  Psychiatric:        Mood and Affect: Mood normal.        Behavior: Behavior normal.        Thought Content: Thought content normal.        Judgment: Judgment normal.      UC Treatments / Results  Labs (all labs ordered are listed, but only abnormal results are displayed) Labs Reviewed - No data to display  EKG   Radiology No results found.  Procedures Procedures (including critical care time)  Medications Ordered in UC Medications - No data to display  Initial Impression / Assessment and Plan / UC Course  I have reviewed the triage vital signs and the nursing notes.  Pertinent labs & imaging results that were available during my care of the patient were reviewed by me and considered in my medical decision making (see chart for details).     Impacted cerumen was successfully resolved by lavage performed by RN.  The patient tolerated the procedure well, and there were no complications.  Final Clinical Impressions(s) / UC Diagnoses   Final diagnoses:  Impacted cerumen, bilateral     Discharge Instructions      -Follow-up as needed!    ED Prescriptions   None    PDMP not reviewed this encounter.   Arlyss Leita BRAVO, PA-C 12/11/23  1331  

## 2023-12-11 NOTE — ED Triage Notes (Signed)
 Patient c/o right ear pain and fullness that started this morning. Patient denies fevers.

## 2024-03-14 ENCOUNTER — Other Ambulatory Visit: Payer: Self-pay | Admitting: Physician Assistant

## 2024-03-14 DIAGNOSIS — R921 Mammographic calcification found on diagnostic imaging of breast: Secondary | ICD-10-CM

## 2024-03-14 DIAGNOSIS — Z9889 Other specified postprocedural states: Secondary | ICD-10-CM

## 2024-03-14 DIAGNOSIS — R928 Other abnormal and inconclusive findings on diagnostic imaging of breast: Secondary | ICD-10-CM

## 2024-04-17 ENCOUNTER — Inpatient Hospital Stay: Admission: RE | Admit: 2024-04-17 | Source: Ambulatory Visit
